# Patient Record
Sex: Female | Born: 1990 | Hispanic: No | Smoking: Never smoker
Health system: Southern US, Community
[De-identification: ages and names within clinical notes are randomized; demographics above are authoritative.]

## PROBLEM LIST (undated history)

## (undated) DIAGNOSIS — F419 Anxiety disorder, unspecified: Secondary | ICD-10-CM

## (undated) DIAGNOSIS — F329 Major depressive disorder, single episode, unspecified: Secondary | ICD-10-CM

## (undated) DIAGNOSIS — Z862 Personal history of diseases of the blood and blood-forming organs and certain disorders involving the immune mechanism: Secondary | ICD-10-CM

## (undated) DIAGNOSIS — F32A Depression, unspecified: Secondary | ICD-10-CM

## (undated) DIAGNOSIS — S92009A Unspecified fracture of unspecified calcaneus, initial encounter for closed fracture: Secondary | ICD-10-CM

## (undated) DIAGNOSIS — D693 Immune thrombocytopenic purpura: Secondary | ICD-10-CM

## (undated) HISTORY — DX: Personal history of diseases of the blood and blood-forming organs and certain disorders involving the immune mechanism: Z86.2

## (undated) HISTORY — DX: Major depressive disorder, single episode, unspecified: F32.9

## (undated) HISTORY — DX: Anxiety disorder, unspecified: F41.9

## (undated) HISTORY — DX: Unspecified fracture of unspecified calcaneus, initial encounter for closed fracture: S92.009A

## (undated) HISTORY — DX: Depression, unspecified: F32.A

## (undated) HISTORY — DX: Immune thrombocytopenic purpura: D69.3

---
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
## 2009-01-08 HISTORY — PX: GALLBLADDER SURGERY: SHX652

## 2009-06-10 DIAGNOSIS — D693 Immune thrombocytopenic purpura: Secondary | ICD-10-CM

## 2009-06-10 HISTORY — PX: SPLENECTOMY, TOTAL: SHX788

## 2009-06-10 HISTORY — DX: Immune thrombocytopenic purpura: D69.3

## 2010-05-10 HISTORY — PX: SPLENECTOMY: SUR1306

## 2014-09-12 ENCOUNTER — Encounter: Payer: Self-pay | Admitting: Certified Nurse Midwife

## 2014-09-12 ENCOUNTER — Ambulatory Visit (INDEPENDENT_AMBULATORY_CARE_PROVIDER_SITE_OTHER): Payer: BC Managed Care – PPO | Admitting: Certified Nurse Midwife

## 2014-09-12 VITALS — BP 110/70 | HR 88 | Resp 18 | Ht 66.0 in | Wt 144.0 lb

## 2014-09-12 DIAGNOSIS — Z124 Encounter for screening for malignant neoplasm of cervix: Secondary | ICD-10-CM | POA: Diagnosis not present

## 2014-09-12 DIAGNOSIS — Z30011 Encounter for initial prescription of contraceptive pills: Secondary | ICD-10-CM | POA: Diagnosis not present

## 2014-09-12 DIAGNOSIS — Z0000 Encounter for general adult medical examination without abnormal findings: Secondary | ICD-10-CM

## 2014-09-12 DIAGNOSIS — Z01419 Encounter for gynecological examination (general) (routine) without abnormal findings: Secondary | ICD-10-CM

## 2014-09-12 LAB — CBC WITH DIFFERENTIAL/PLATELET
Basophils Absolute: 0.1 10*3/uL (ref 0.0–0.1)
Basophils Relative: 1 % (ref 0–1)
Eosinophils Absolute: 0.1 10*3/uL (ref 0.0–0.7)
Eosinophils Relative: 1 % (ref 0–5)
HCT: 41.8 % (ref 36.0–46.0)
Hemoglobin: 14.2 g/dL (ref 12.0–15.0)
Lymphocytes Relative: 42 % (ref 12–46)
Lymphs Abs: 2.6 10*3/uL (ref 0.7–4.0)
MCH: 29.8 pg (ref 26.0–34.0)
MCHC: 34 g/dL (ref 30.0–36.0)
MCV: 87.6 fL (ref 78.0–100.0)
MPV: 10.5 fL (ref 8.6–12.4)
Monocytes Absolute: 0.4 10*3/uL (ref 0.1–1.0)
Monocytes Relative: 6 % (ref 3–12)
NEUTROS ABS: 3.1 10*3/uL (ref 1.7–7.7)
NEUTROS PCT: 50 % (ref 43–77)
Platelets: 361 10*3/uL (ref 150–400)
RBC: 4.77 MIL/uL (ref 3.87–5.11)
RDW: 13.6 % (ref 11.5–15.5)
WBC: 6.2 10*3/uL (ref 4.0–10.5)

## 2014-09-12 LAB — POCT URINALYSIS DIPSTICK
BILIRUBIN UA: NEGATIVE
Glucose, UA: NEGATIVE
Ketones, UA: NEGATIVE
Leukocytes, UA: NEGATIVE
Nitrite, UA: NEGATIVE
Protein, UA: NEGATIVE
RBC UA: NEGATIVE
Urobilinogen, UA: NEGATIVE
pH, UA: 6

## 2014-09-12 MED ORDER — LEVONORGEST-ETH ESTRAD 91-DAY 0.15-0.03 &0.01 MG PO TABS: 1.0000 | ORAL_TABLET | Freq: Every day | ORAL | Status: DC

## 2014-09-12 NOTE — Patient Instructions (Signed)
General topics  Next pap or exam is  due in 1 year Take a Women's multivitamin Take 1200 mg. of calcium daily - prefer dietary If any concerns in interim to call back  Breast Self-Awareness Practicing breast self-awareness may pick up problems early, prevent significant medical complications, and possibly save your life. By practicing breast self-awareness, you can become familiar with how your breasts look and feel and if your breasts are changing. This allows you to notice changes early. It can also offer you some reassurance that your breast health is good. One way to learn what is normal for your breasts and whether your breasts are changing is to do a breast self-exam. If you find a lump or something that was not present in the past, it is best to contact your caregiver right away. Other findings that should be evaluated by your caregiver include nipple discharge, especially if it is bloody; skin changes or reddening; areas where the skin seems to be pulled in (retracted); or new lumps and bumps. Breast pain is seldom associated with cancer (malignancy), but should also be evaluated by a caregiver. BREAST SELF-EXAM The best time to examine your breasts is 5 7 days after your menstrual period is over.  ExitCare Patient Information 2013 [employer].   Exercise to Stay Healthy Exercise helps you become and stay healthy. EXERCISE IDEAS AND TIPS Choose exercises that:  You enjoy.  Fit into your day. You do not need to exercise really hard to be healthy. You can do exercises at a slow or medium level and stay healthy. You can:  Stretch before and after working out.  Try yoga, Pilates, or tai chi.  Lift weights.  Walk fast, swim, jog, run, climb stairs, bicycle, dance, or rollerskate.  Take aerobic classes. Exercises that burn about 150 calories:  Running 1  miles in 15 minutes.  Playing volleyball for 45 to 60 minutes.  Washing and waxing a car for 45 to 60  minutes.  Playing touch football for 45 minutes.  Walking 1  miles in 35 minutes.  Pushing a stroller 1  miles in 30 minutes.  Playing basketball for 30 minutes.  Raking leaves for 30 minutes.  Bicycling 5 miles in 30 minutes.  Walking 2 miles in 30 minutes.  Dancing for 30 minutes.  Shoveling snow for 15 minutes.  Swimming laps for 20 minutes.  Walking up stairs for 15 minutes.  Bicycling 4 miles in 15 minutes.  Gardening for 30 to 45 minutes.  Jumping rope for 15 minutes.  Washing windows or floors for 45 to 60 minutes. Document Released: 06/29/2010 Document Revised: 08/19/2011 Document Reviewed: 06/29/2010 ExitCare Patient Information 2013 [employer].   Other topics ( that may be useful information):    Sexually Transmitted Disease Sexually transmitted disease (STD) refers to any infection that is passed from person to person during sexual activity. This may happen by way of saliva, semen, blood, vaginal mucus, or urine. Common STDs include:  Gonorrhea.  Chlamydia.  Syphilis.  HIV/AIDS.  Genital herpes.  Hepatitis B and C.  Trichomonas.  Human papillomavirus (HPV).  Pubic lice. CAUSES  An STD may be spread by bacteria, virus, or parasite. A person can get an STD by:  Sexual intercourse with an infected person.  Sharing sex toys with an infected person.  Sharing needles with an infected person.  Having intimate contact with the genitals, mouth, or rectal areas of an infected person. SYMPTOMS  Some people may not have any symptoms, but   they can still pass the infection to others. Different STDs have different symptoms. Symptoms include:  Painful or bloody urination.  Pain in the pelvis, abdomen, vagina, anus, throat, or eyes.  Skin rash, itching, irritation, growths, or sores (lesions). These usually occur in the genital or anal area.  Abnormal vaginal discharge.  Penile discharge in men.  Soft, flesh-colored skin growths in the  genital or anal area.  Fever.  Pain or bleeding during sexual intercourse.  Swollen glands in the groin area.  Yellow skin and eyes (jaundice). This is seen with hepatitis. DIAGNOSIS  To make a diagnosis, your caregiver may:  Take a medical history.  Perform a physical exam.  Take a specimen (culture) to be examined.  Examine a sample of discharge under a microscope.  Perform blood test TREATMENT   Chlamydia, gonorrhea, trichomonas, and syphilis can be cured with antibiotic medicine.  Genital herpes, hepatitis, and HIV can be treated, but not cured, with prescribed medicines. The medicines will lessen the symptoms.  Genital warts from HPV can be treated with medicine or by freezing, burning (electrocautery), or surgery. Warts may come back.  HPV is a virus and cannot be cured with medicine or surgery.However, abnormal areas may be followed very closely by your caregiver and may be removed from the cervix, vagina, or vulva through office procedures or surgery. If your diagnosis is confirmed, your recent sexual partners need treatment. This is true even if they are symptom-free or have a negative culture or evaluation. They should not have sex until their caregiver says it is okay. HOME CARE INSTRUCTIONS  All sexual partners should be informed, tested, and treated for all STDs.  Take your antibiotics as directed. Finish them even if you start to feel better.  Only take over-the-counter or prescription medicines for pain, discomfort, or fever as directed by your caregiver.  Rest.  Eat a balanced diet and drink enough fluids to keep your urine clear or pale yellow.  Do not have sex until treatment is completed and you have followed up with your caregiver. STDs should be checked after treatment.  Keep all follow-up appointments, Pap tests, and blood tests as directed by your caregiver.  Only use latex condoms and water-soluble lubricants during sexual activity. Do not use  petroleum jelly or oils.  Avoid alcohol and illegal drugs.  Get vaccinated for HPV and hepatitis. If you have not received these vaccines in the past, talk to your caregiver about whether one or both might be right for you.  Avoid risky sex practices that can break the skin. The only way to avoid getting an STD is to avoid all sexual activity.Latex condoms and dental dams (for oral sex) will help lessen the risk of getting an STD, but will not completely eliminate the risk. SEEK MEDICAL CARE IF:   You have a fever.  You have any new or worsening symptoms. Document Released: 08/17/2002 Document Revised: 08/19/2011 Document Reviewed: 08/24/2010 [facility] Patient Information 2013 [employer].    Domestic Abuse You are being battered or abused if someone close to you hits, pushes, or physically hurts you in any way. You also are being abused if you are forced into activities. You are being sexually abused if you are forced to have sexual contact of any kind. You are being emotionally abused if you are made to feel worthless or if you are constantly threatened. It is important to remember that help is available. No one has the right to abuse you. PREVENTION OF FURTHER  ABUSE  Learn the warning signs of danger. This varies with situations but may include: the use of alcohol, threats, isolation from friends and family, or forced sexual contact. Leave if you feel that violence is going to occur.  If you are attacked or beaten, report it to the police so the abuse is documented. You do not have to press charges. The police can protect you while you or the attackers are leaving. Get the officer's name and badge number and a copy of the report.  Find someone you can trust and tell them what is happening to you: your caregiver, a nurse, clergy member, close friend or family member. Feeling ashamed is natural, but remember that you have done nothing wrong. No one deserves abuse. Document Released:  05/24/2000 Document Revised: 08/19/2011 Document Reviewed: 08/02/2010 ExitCare Patient Information 2013 [employer].    How Much is Too Much Alcohol? Drinking too much alcohol can cause injury, accidents, and health problems. These types of problems can include:   Car crashes.  Falls.  Family fighting (domestic violence).  Drowning.  Fights.  Injuries.  Burns.  Damage to certain organs.  Having a baby with birth defects. ONE DRINK CAN BE TOO MUCH WHEN YOU ARE:  Working.  Pregnant or breastfeeding.  Taking medicines. Ask your doctor.  Driving or planning to drive. If you or someone you know has a drinking problem, get help from a doctor.  Document Released: 03/23/2009 Document Revised: 08/19/2011 Document Reviewed: 03/23/2009 ExitCare Patient Information 2013 [employer].   Smoking Hazards Smoking cigarettes is extremely bad for your health. Tobacco smoke has over 200 known poisons in it. There are over 60 chemicals in tobacco smoke that cause cancer. Some of the chemicals found in cigarette smoke include:   Cyanide.  Benzene.  Formaldehyde.  Methanol (wood alcohol).  Acetylene (fuel used in welding torches).  Ammonia. Cigarette smoke also contains the poisonous gases nitrogen oxide and carbon monoxide.  Cigarette smokers have an increased risk of many serious medical problems and Smoking causes approximately:  90% of all lung cancer deaths in men.  80% of all lung cancer deaths in women.  90% of deaths from chronic obstructive lung disease. Compared with nonsmokers, smoking increases the risk of:  Coronary heart disease by 2 to 4 times.  Stroke by 2 to 4 times.  Men developing lung cancer by 23 times.  Women developing lung cancer by 13 times.  Dying from chronic obstructive lung diseases by 12 times.  . Smoking is the most preventable cause of death and disease in our society.  WHY IS SMOKING ADDICTIVE?  Nicotine is the chemical  agent in tobacco that is capable of causing addiction or dependence.  When you smoke and inhale, nicotine is absorbed rapidly into the bloodstream through your lungs. Nicotine absorbed through the lungs is capable of creating a powerful addiction. Both inhaled and non-inhaled nicotine may be addictive.  Addiction studies of cigarettes and spit tobacco show that addiction to nicotine occurs mainly during the teen years, when young people begin using tobacco products. WHAT ARE THE BENEFITS OF QUITTING?  There are many health benefits to quitting smoking.   Likelihood of developing cancer and heart disease decreases. Health improvements are seen almost immediately.  Blood pressure, pulse rate, and breathing patterns start returning to normal soon after quitting. QUITTING SMOKING   American Lung Association - 1-800-LUNGUSA  American Cancer Society - 1-800-ACS-2345 Document Released: 07/04/2004 Document Revised: 08/19/2011 Document Reviewed: 03/08/2009 ExitCare Patient Information 2013 ExitCare,   LLC.   Stress Management Stress is a state of physical or mental tension that often results from changes in your life or normal routine. Some common causes of stress are:  Death of a loved one.  Injuries or severe illnesses.  Getting fired or changing jobs.  Moving into a new home. Other causes may be:  Sexual problems.  Business or financial losses.  Taking on a large debt.  Regular conflict with someone at home or at work.  Constant tiredness from lack of sleep. It is not just bad things that are stressful. It may be stressful to:  Win the lottery.  Get married.  Buy a new car. The amount of stress that can be easily tolerated varies from person to person. Changes generally cause stress, regardless of the types of change. Too much stress can affect your health. It may lead to physical or emotional problems. Too little stress (boredom) may also become stressful. SUGGESTIONS TO  REDUCE STRESS:  Talk things over with your family and friends. It often is helpful to share your concerns and worries. If you feel your problem is serious, you may want to get help from a professional counselor.  Consider your problems one at a time instead of lumping them all together. Trying to take care of everything at once may seem impossible. List all the things you need to do and then start with the most important one. Set a goal to accomplish 2 or 3 things each day. If you expect to do too many in a single day you will naturally fail, causing you to feel even more stressed.  Do not use alcohol or drugs to relieve stress. Although you may feel better for a short time, they do not remove the problems that caused the stress. They can also be habit forming.  Exercise regularly - at least 3 times per week. Physical exercise can help to relieve that "uptight" feeling and will relax you.  The shortest distance between despair and hope is often a good night's sleep.  Go to bed and get up on time allowing yourself time for appointments without being rushed.  Take a short "time-out" period from any stressful situation that occurs during the day. Close your eyes and take some deep breaths. Starting with the muscles in your face, tense them, hold it for a few seconds, then relax. Repeat this with the muscles in your neck, shoulders, hand, stomach, back and legs.  Take good care of yourself. Eat a balanced diet and get plenty of rest.  Schedule time for having fun. Take a break from your daily routine to relax. HOME CARE INSTRUCTIONS   Call if you feel overwhelmed by your problems and feel you can no longer manage them on your own.  Return immediately if you feel like hurting yourself or someone else. Document Released: 11/20/2000 Document Revised: 08/19/2011 Document Reviewed: 07/13/2007 ExitCare Patient Information 2013 ExitCare[employer].   

## 2014-09-12 NOTE — Progress Notes (Signed)
24 y.o. G0P0000 Single  Caucasian Fe here for to establish gyn care and for annual exam. Periods every three months with [facility] for contraception. Getting married in 06/10/15. History of ITP with splenectomy which resolved ITP. Last follow up over 6 months. Desires CBC today. Sees Urgent care if needed. No health issues today.  Patient's last menstrual period was 07/22/2014.          Sexually active: Yes.    The current method of family planning is OCP (estrogen/progesterone).    Exercising: Yes.    body weight, weight lifting, cardio Smoker:  no  Health Maintenance: Pap: 09/2013 Normal  No abnormal pap smears Self Breast Check: No Gardasil: No TDaP:  2011 Labs: Will talk with provider first  [id number]   reports that she has never smoked. She has never used smokeless tobacco. She reports that she drinks about 1.8 oz of alcohol per week. She reports that she does not use illicit drugs.  Past Medical History  Diagnosis Date  . ITP (idiopathic thrombocytopenic purpura) 2011    Spleenectomy     Past Surgical History  Procedure Laterality Date  . Cholecystectomy  2010  . 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 Levonorgest-Eth Estrad 91-Day (SEASONIQUE PO) Take 1 tablet by mouth daily.     No current facility-administered medications for this visit.    History reviewed. No pertinent family history.  ROS:  Pertinent items are noted in HPI.  Otherwise, a comprehensive ROS was negative.  Exam:   BP 110/70 mmHg  Pulse 88  Resp 18  Ht  (1.676 m)  Wt 144 lb (65.318 kg)  BMI 23.25 kg/m2  LMP 07/22/2014 Height:  (167.6 cm) Ht Readings from Last 3 Encounters:  09/12/14  (1.676 m)    General appearance: alert, cooperative and appears stated age Head: Normocephalic, without obvious abnormality, atraumatic Neck: no adenopathy, supple, symmetrical, trachea midline and thyroid normal to  inspection and palpation Lungs: clear to auscultation bilaterally Breasts: normal appearance, no masses or tenderness, No nipple retraction or dimpling, No nipple discharge or bleeding, No axillary or supraclavicular adenopathy Heart: regular rate and rhythm Abdomen: soft, non-tender; no masses,  no organomegaly Extremities: extremities normal, atraumatic, no cyanosis or edema Skin: Skin color, texture, turgor normal. No rashes or lesions Lymph nodes: Cervical, supraclavicular, and axillary nodes normal. No abnormal inguinal nodes palpated Neurologic: Grossly normal   Pelvic: External genitalia:  no lesions              Urethra:  normal appearing urethra with no masses, tenderness or lesions              Bartholin's and Skene's: normal                 Vagina: normal appearing vagina with normal color and discharge, no lesions              Cervix: normal,non tender, no lesions              Pap taken: Yes.   Bimanual Exam:  Uterus:  normal size, contour, position, consistency, mobility, non-tender              Adnexa: normal adnexa and no mass, fullness, tenderness               Rectovaginal: Confirms  Anus:  normal appearance  Chaperone present: Yes  A:  Well Woman with normal exam  Contraception OCP desired  History of ITP with Splenectomy, ITP resolved with surgery needs follow up CBC with diff today, since moving  Gardasil candidate  P:   Reviewed health and wellness pertinent to exam  Rx Camrese see order  Lab: CBC with diff. Discussed may need follow up if abnormal  Aware of benefits declines today  Pap smear taken today with HPV reflex   counseled on breast self exam, HIV risk factors and prevention, adequate intake of calcium and vitamin D, diet and exercise  return annually or prn  An After Visit Summary was printed and given to the patient.

## 2014-09-12 NOTE — Progress Notes (Signed)
Reviewed personally.  M. Suzanne Garon Melander, MD.  

## 2014-09-13 LAB — IPS PAP TEST WITH REFLEX TO HPV

## 2014-09-21 ENCOUNTER — Telehealth: Payer: Self-pay | Admitting: Certified Nurse Midwife

## 2014-09-21 NOTE — Telephone Encounter (Signed)
Left message to reschedule aex appointment. °

## 2015-09-15 ENCOUNTER — Ambulatory Visit (INDEPENDENT_AMBULATORY_CARE_PROVIDER_SITE_OTHER): Payer: BC Managed Care – PPO | Admitting: Certified Nurse Midwife

## 2015-09-15 ENCOUNTER — Encounter: Payer: Self-pay | Admitting: Certified Nurse Midwife

## 2015-09-15 VITALS — BP 102/68 | HR 70 | Resp 16 | Ht 65.25 in | Wt 149.0 lb

## 2015-09-15 DIAGNOSIS — Z862 Personal history of diseases of the blood and blood-forming organs and certain disorders involving the immune mechanism: Secondary | ICD-10-CM

## 2015-09-15 DIAGNOSIS — Z01419 Encounter for gynecological examination (general) (routine) without abnormal findings: Secondary | ICD-10-CM | POA: Diagnosis not present

## 2015-09-15 DIAGNOSIS — Z30011 Encounter for initial prescription of contraceptive pills: Secondary | ICD-10-CM | POA: Diagnosis not present

## 2015-09-15 LAB — CBC
HCT: 42.7 % (ref 35.0–45.0)
HEMOGLOBIN: 14.2 g/dL (ref 11.7–15.5)
MCH: 29.3 pg (ref 27.0–33.0)
MCHC: 33.3 g/dL (ref 32.0–36.0)
MCV: 88.2 fL (ref 80.0–100.0)
MPV: 10.7 fL (ref 7.5–12.5)
Platelets: 347 10*3/uL (ref 140–400)
RBC: 4.84 MIL/uL (ref 3.80–5.10)
RDW: 13.6 % (ref 11.0–15.0)
WBC: 8.1 10*3/uL (ref 3.8–10.8)

## 2015-09-15 MED ORDER — LEVONORGEST-ETH ESTRAD 91-DAY 0.15-0.03 &0.01 MG PO TABS
1.0000 | ORAL_TABLET | Freq: Every day | ORAL | Status: DC
Start: 2015-09-15 — End: 2016-09-17

## 2015-09-15 NOTE — Patient Instructions (Signed)

## 2015-09-15 NOTE — Progress Notes (Signed)
25 y.o. G0P0000 Married  Caucasian Fe here for annual exam. Periods normal, no issues. Contraception working well. Honeymoon was [place]! No health issues  today. Sees Urgent care if needed.  Patient's last menstrual period was 07/20/2015.          Sexually active: Yes.    The current method of family planning is OCP (estrogen/progesterone).    Exercising: Yes.    cardio Smoker:  no  Health Maintenance: Pap:  09-12-14 neg MMG:  none Colonoscopy:  none BMD:   none TDaP:  2011 Shingles: no Pneumonia: no Hep C and HIV: HIV neg 2011 Labs: none Self breast exam: not done   reports that she has never smoked. She has never used smokeless tobacco. She reports that she drinks about 0.6 - 1.8 oz of alcohol per week. She reports that she does not use illicit drugs.  Past Medical History  Diagnosis Date  . ITP (idiopathic thrombocytopenic purpura) 2011    Spleenectomy     Past Surgical History  Procedure Laterality Date  . Cholecystectomy  2010  . 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 Levonorgestrel-Ethinyl Estradiol (AMETHIA,CAMRESE) 0.15-0.03 &0.01 MG tablet Take 1 tablet by mouth daily. 1 Package 4   No current facility-administered medications for this visit.    Family History  Problem Relation Age of Onset  . Hypertension Mother   . Stroke Paternal Grandfather   . Hypertension Maternal Grandmother     ROS:  Pertinent items are noted in HPI.  Otherwise, a comprehensive ROS was negative.  Exam:   BP 102/68 mmHg  Pulse 70  Resp 16  Ht 5' 5.25" (1.657 m)  Wt 149 lb (67.586 kg)  BMI 24.62 kg/m2  LMP 07/20/2015 Height: 5' 5.25" (165.7 cm) Ht Readings from Last 3 Encounters:  09/15/15 5' 5.25" (1.657 m)  09/12/14 5\' 6"  (1.676 m)    General appearance: alert, cooperative and appears stated age Head: Normocephalic, without obvious abnormality, atraumatic Neck: no adenopathy, supple, symmetrical,  trachea midline and thyroid normal to inspection and palpation Lungs: clear to auscultation bilaterally Breasts: normal appearance, no masses or tenderness, No nipple retraction or dimpling, No nipple discharge or bleeding, No axillary or supraclavicular adenopathy Heart: regular rate and rhythm Abdomen: soft, non-tender; no masses,  no organomegaly Extremities: extremities normal, atraumatic, no cyanosis or edema Skin: Skin color, texture, turgor normal. No rashes or lesions Lymph nodes: Cervical, supraclavicular, and axillary nodes normal. No abnormal inguinal nodes palpated Neurologic: Grossly normal   Pelvic: External genitalia:  no lesions              Urethra:  normal appearing urethra with no masses, tenderness or lesions              Bartholin's and Skene's: normal                 Vagina: normal appearing vagina with normal color and discharge, no lesions              Cervix: no cervical motion tenderness, no lesions and nulliparous appearance              Pap taken: No. Bimanual Exam:  Uterus:  normal size, contour, position, consistency, mobility, non-tender              Adnexa: normal adnexa and no mass, fullness, tenderness  Rectovaginal: Confirms               Anus:  normal   Chaperone present: yes  A:  Well Woman with normal exam  Contraception desires OCP  History of ITP, needs CBC  P:   Reviewed health and wellness pertinent to exam  Rx Amethia see order  Lab CBC  Pap smear as above not taken   counseled on breast self exam, use and side effects of OCP's, adequate intake of calcium and vitamin D, diet and exercise  return annually or prn    An After Visit Summary was printed and given to the patient.

## 2015-09-18 ENCOUNTER — Ambulatory Visit: Payer: BC Managed Care – PPO | Admitting: Certified Nurse Midwife

## 2015-09-26 NOTE — Progress Notes (Signed)
Encounter reviewed Jill Jertson, MD   

## 2015-10-15 ENCOUNTER — Other Ambulatory Visit: Payer: Self-pay | Admitting: Certified Nurse Midwife

## 2015-10-16 NOTE — Telephone Encounter (Signed)
09/15/15 #1 pack/12 rfs was sent to [facility] in [place]-rx refused.

## 2016-01-16 ENCOUNTER — Other Ambulatory Visit: Payer: Self-pay | Admitting: Certified Nurse Midwife

## 2016-01-16 DIAGNOSIS — Z30011 Encounter for initial prescription of contraceptive pills: Secondary | ICD-10-CM

## 2016-01-16 NOTE — Telephone Encounter (Signed)
Medication refill request: DAYSEE 0.15-0.03 Last AEX:  09/15/15 DL Next AEX: 4/09/814/10/18 Last MMG (if hormonal medication request): n/a Refill authorized: 09/15/15 #1pack w/12refills; today refused, patient should have enough refills

## 2016-09-17 ENCOUNTER — Ambulatory Visit (INDEPENDENT_AMBULATORY_CARE_PROVIDER_SITE_OTHER): Payer: BC Managed Care – PPO | Admitting: Certified Nurse Midwife

## 2016-09-17 ENCOUNTER — Encounter: Payer: Self-pay | Admitting: Certified Nurse Midwife

## 2016-09-17 VITALS — BP 120/70 | HR 60 | Resp 16 | Ht 65.25 in | Wt 162.0 lb

## 2016-09-17 DIAGNOSIS — Z01419 Encounter for gynecological examination (general) (routine) without abnormal findings: Secondary | ICD-10-CM | POA: Diagnosis not present

## 2016-09-17 DIAGNOSIS — Z30011 Encounter for initial prescription of contraceptive pills: Secondary | ICD-10-CM | POA: Diagnosis not present

## 2016-09-17 DIAGNOSIS — Z862 Personal history of diseases of the blood and blood-forming organs and certain disorders involving the immune mechanism: Secondary | ICD-10-CM

## 2016-09-17 LAB — CBC
HEMATOCRIT: 40.3 % (ref 35.0–45.0)
Hemoglobin: 13.2 g/dL (ref 11.7–15.5)
MCH: 28.6 pg (ref 27.0–33.0)
MCHC: 32.8 g/dL (ref 32.0–36.0)
MCV: 87.4 fL (ref 80.0–100.0)
MPV: 10.2 fL (ref 7.5–12.5)
Platelets: 356 10*3/uL (ref 140–400)
RBC: 4.61 MIL/uL (ref 3.80–5.10)
RDW: 14 % (ref 11.0–15.0)
WBC: 10.8 10*3/uL (ref 3.8–10.8)

## 2016-09-17 MED ORDER — LEVONORGEST-ETH ESTRAD 91-DAY 0.15-0.03 &0.01 MG PO TABS: 1.0000 | ORAL_TABLET | Freq: Every day | ORAL | 4 refills | Status: DC

## 2016-09-17 NOTE — Progress Notes (Signed)
26 y.o. G0P0000 Married  Caucasian Fe here for annual exam. Periods normal no issues. Has missed 2 pills in the past year.  Contraception Seasonique working well. No health issues. Sees Urgent care if needed. No further problems with ITP. Not sure if she needed further follow up. Planning time for self and spouse.  Patient's last menstrual period was 07/18/2016 (exact date).          Sexually active: Yes.    The current method of family planning is OCP (estrogen/progesterone).    Exercising: Yes.    running, rowing, weights Smoker:  no  Health Maintenance: Pap:  09-12-14 neg MMG:  none Colonoscopy:  none BMD:   none TDaP:  2011 Shingles: no Pneumonia:  Hep C and HIV: HIV neg 2011 Labs: none Self breast exam: not done   reports that she has never smoked. She has never used smokeless tobacco. She reports that she drinks alcohol. She reports that she does not use drugs.  Past Medical History:  Diagnosis Date  . 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 CHOLECYSTECTOMY  2010  . 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 Levonorgestrel-Ethinyl Estradiol (AMETHIA,CAMRESE) 0.15-0.03 &0.01 MG tablet Take 1 tablet by mouth daily. 1 Package 12   No current facility-administered medications for this visit.     Family History  Problem Relation Age of Onset  . Hypertension Mother   . Stroke Paternal Grandfather   . Hypertension Maternal Grandmother     ROS:  Pertinent items are noted in HPI.  Otherwise, a comprehensive ROS was negative.  Exam:   BP 120/70   Pulse 60   Resp 16   Ht 5' 5.25" (1.657 m)   Wt 162 lb (73.5 kg)   LMP 07/18/2016 (Exact Date)   BMI 26.75 kg/m  Height: 5' 5.25" (165.7 cm) Ht Readings from Last 3 Encounters:  09/17/16 5' 5.25" (1.657 m)  09/15/15 5' 5.25" (1.657 m)  09/12/14  (1.676 m)    General  appearance: alert, cooperative and appears stated age Head: Normocephalic, without obvious abnormality, atraumatic Neck: no adenopathy, supple, symmetrical, trachea midline and thyroid normal to inspection and palpation Lungs: clear to auscultation bilaterally Breasts: normal appearance, no masses or tenderness, No nipple retraction or dimpling, No nipple discharge or bleeding, No axillary or supraclavicular adenopathy Heart: regular rate and rhythm Abdomen: soft, non-tender; no masses,  no organomegaly Extremities: extremities normal, atraumatic, no cyanosis or edema Skin: Skin color, texture, turgor normal. No rashes or lesions Lymph nodes: Cervical, supraclavicular, and axillary nodes normal. No abnormal inguinal nodes palpated Neurologic: Grossly normal   Pelvic: External genitalia:  no lesions              Urethra:  normal appearing urethra with no masses, tenderness or lesions              Bartholin's and Skene's: normal                 Vagina: normal appearing vagina with normal color and discharge, no lesions              Cervix: no cervical motion tenderness, no lesions and nulliparous appearance              Pap taken: No. Bimanual Exam:  Uterus:  normal size, contour, position, consistency, mobility, non-tender and  anteverted              Adnexa: normal adnexa and no mass, fullness, tenderness               Rectovaginal: Confirms               Anus:  normal sphincter tone, no lesions  Chaperone present: yes  A:  Well Woman with normal exam  Contraception OCP desired  History of ITP with spleenectomy for control  P:   Reviewed health and wellness pertinent to exam  Rx Seasonique see order with instructions  Will have patient sign for records where care was given and advise.  Lab CBC  Pap smear as above   counseled on breast self exam, use and side effects of OCP's, adequate intake of calcium and vitamin D, diet and exercise  return annually or prn  An After Visit  Summary was printed and given to the patient.

## 2016-09-17 NOTE — Patient Instructions (Signed)

## 2016-09-18 NOTE — Progress Notes (Signed)
Encounter reviewed Kaiden Dardis, MD   

## 2016-10-10 ENCOUNTER — Other Ambulatory Visit: Payer: Self-pay | Admitting: Certified Nurse Midwife

## 2016-10-10 DIAGNOSIS — Z30011 Encounter for initial prescription of contraceptive pills: Secondary | ICD-10-CM

## 2017-06-23 ENCOUNTER — Ambulatory Visit: Payer: BC Managed Care – PPO | Admitting: Family Medicine

## 2017-06-23 ENCOUNTER — Encounter: Payer: Self-pay | Admitting: Family Medicine

## 2017-06-23 ENCOUNTER — Other Ambulatory Visit: Payer: Self-pay

## 2017-06-23 VITALS — BP 146/94 | HR 83 | Temp 99.3°F | Resp 16 | Ht 66.34 in | Wt 162.8 lb

## 2017-06-23 DIAGNOSIS — R0789 Other chest pain: Secondary | ICD-10-CM

## 2017-06-23 MED ORDER — CYCLOBENZAPRINE HCL 10 MG PO TABS: 10.0000 mg | ORAL_TABLET | Freq: Three times a day (TID) | ORAL | 0 refills | Status: DC | PRN

## 2017-06-23 NOTE — Patient Instructions (Signed)
1. Most likely muscle sprain. Rest, ice and gentle stretching. Continue with ibuprofen as needed. Adding a muscle relaxant. It can make you a bit tired, if so either taken only at bedtime, or try 1/2 tab during the day.

## 2017-06-23 NOTE — Progress Notes (Signed)
1/14/20191:14 PM  Ana Becker 12/04/1990, 27 y.o. female [id number]  Chief Complaint  Patient presents with  . Chest Pain    x1 week after excercising - rowing. Left side. Constant. Hx of anxiety. Alternating Tylenol and advil, is helping. Pain wraps around to back. Discomfort in neck and shoulder.    HPI:   Patient is a 27 y.o. female who presents today for left sided chest pain. Patient states that it first right after she completed a marathon in December. Pain under her left axilla. Intermittent. Eventually resolved. However a week ago she was rowing, as exercise, and it flared up. This time it has not resolved and now it radiates to her left ant chest, up her neck and towards her back. Has been doing tylenol, ibuprofen, anti-acids, heat pack without much relief. Patient worried it might be cardiac.  No flowsheet data found.  No Known Allergies  Prior to Admission medications   Medication Sig Start Date End Date Taking? Authorizing Provider  ALPRAZolam Prudy Feeler) 0.5 MG tablet Take 0.5 mg by mouth as needed for anxiety.   Yes [provider]  famotidine (PEPCID) 10 MG tablet Take 10 mg by mouth daily.   Yes [provider]  lansoprazole (PREVACID) 15 MG capsule Take 15 mg by mouth daily.   Yes [provider]  Levonorgestrel-Ethinyl Estradiol (AMETHIA,CAMRESE) 0.15-0.03 &0.01 MG tablet Take 1 tablet by mouth daily. 09/17/16  Yes Verner Chol, CNM    Past Medical History:  Diagnosis Date  . Anxiety   . Depression   . History of ITP   . 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 CHOLECYSTECTOMY  2010  . GALLBLADDER SURGERY  01/2009  . SPLENECTOMY  05/2010  . 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 Smoking status: Never Smoker  . Smokeless tobacco: Never Used  Substance Use Topics  . Alcohol use: Yes    Alcohol/week: 0.0 - 0.6  oz    Family History  Problem Relation Age of Onset  . Hypertension Mother   . Heart disease Father   . Stroke Paternal Grandfather   . Hypertension Maternal Grandmother   . Hypertension Maternal Grandfather   . Heart disease Maternal Grandfather     ROS Per hpi  OBJECTIVE:  Blood pressure (!) 146/94, pulse 83, temperature 99.3 F (37.4 C), resp. rate 16, height 5' 6.34" (1.685 m), weight 162 lb 12.8 oz (73.8 kg), last menstrual period 04/23/2017, SpO2 99 %.  Physical Exam  Constitutional: She is oriented to person, place, and time and well-developed, well-nourished, and in no distress.  HENT:  Head: Normocephalic and atraumatic.  Mouth/Throat: Oropharynx is clear and moist. No oropharyngeal exudate.  Eyes: EOM are normal. Pupils are equal, round, and reactive to light. No scleral icterus.  Neck: Neck supple.  Cardiovascular: Normal rate, regular rhythm, normal heart sounds and intact distal pulses. Exam reveals no gallop and no friction rub.  No murmur heard. Pulmonary/Chest: Effort normal and breath sounds normal. She has no wheezes. She has no rales.  Patient with point tenderness along left intercostal pain, about 4 FB inferior to axilla  Musculoskeletal: She exhibits no edema.  Neurological: She is alert and oriented to person, place, and time. Gait normal.  Skin: Skin is warm and dry.    ASSESSMENT and PLAN  1. Other chest pain Discussed MSK  etiology, conservative measures, rest, ice, cont with nsaids. Adding muscle relaxant, gentle stretching.  - EKG 12-Lead - nsr, no st changes noted, no qwaves - cyclobenzaprine (FLEXERIL) 10 MG tablet; Take 1 tablet (10 mg total) by mouth 3 (three) times daily as needed for muscle spasms.  Return if symptoms worsen or fail to improve.    Myles LippsIrma M Santiago, MD Primary Care at [facility] [address] [place], [place] [address] Ph.  [phone] Fax [phone]

## 2017-09-02 ENCOUNTER — Ambulatory Visit (INDEPENDENT_AMBULATORY_CARE_PROVIDER_SITE_OTHER): Payer: BC Managed Care – PPO

## 2017-09-02 ENCOUNTER — Ambulatory Visit: Payer: BC Managed Care – PPO | Admitting: Family Medicine

## 2017-09-02 ENCOUNTER — Other Ambulatory Visit: Payer: Self-pay

## 2017-09-02 ENCOUNTER — Encounter: Payer: Self-pay | Admitting: Family Medicine

## 2017-09-02 VITALS — BP 125/86 | HR 88 | Temp 98.6°F | Ht 66.5 in | Wt 166.2 lb

## 2017-09-02 DIAGNOSIS — M79672 Pain in left foot: Secondary | ICD-10-CM

## 2017-09-02 MED ORDER — TRAMADOL HCL 50 MG PO TABS: 50.0000 mg | ORAL_TABLET | Freq: Three times a day (TID) | ORAL | 0 refills | Status: DC | PRN

## 2017-09-02 NOTE — Progress Notes (Signed)
3/26/20193:24 PM  Kathyrn DrownHanna Carcamo 11/21/1990, 27 y.o. female [id number]  Chief Complaint  Patient presents with  . Pain    left foot pain due to running. Has been having pain since for 1 wk    HPI:   Patient is a 27 y.o. female who presents today for 1 week of left medial heel pain. She ran a marathon in December and has slowly been increasing her distance again. On Sunday she ran 10 miles for the first time since the marathon. She started noticing the pain on Monday, by the evening she was not able to walk on it. She continues to experience constant pain that is not relived by OTC medications, rest, change in shoes. She does not recall an actual incident/injury. She has never had a stress fracture or any other injuries to her foot.   Depression screen PHQ 2/9 09/02/2017  Decreased Interest 0  Down, Depressed, Hopeless 0  PHQ - 2 Score 0    No Known Allergies  Prior to Admission medications   Medication Sig Start Date End Date Taking? Authorizing Provider  Levonorgestrel-Ethinyl Estradiol (AMETHIA,CAMRESE) 0.15-0.03 &0.01 MG tablet Take 1 tablet by mouth daily. 09/17/16  Yes Verner CholLeonard, Deborah S, CNM    Past Medical History:  Diagnosis Date  . Anxiety   . Depression   . History of ITP   . 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 CHOLECYSTECTOMY  2010  . GALLBLADDER SURGERY  01/2009  . SPLENECTOMY  05/2010  . 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 Smoking status: Never Smoker  . Smokeless tobacco: Never Used  Substance Use Topics  . Alcohol use: Yes    Alcohol/week: 0.0 - 0.6 oz    Family History  Problem Relation Age of Onset  . Hypertension Mother   . Heart disease Father   . Stroke Paternal Grandfather   . Hypertension Maternal Grandmother   . Hypertension Maternal Grandfather   . Heart disease Maternal Grandfather     ROS Per  hpi  OBJECTIVE:  Blood pressure 125/86, pulse 88, temperature 98.6 F (37 C), temperature source Oral, height 5' 6.5" (1.689 m), weight 166 lb 3.2 oz (75.4 kg), SpO2 99 %.  Physical Exam  Gen: AAOx3, NAD MSK: left foot, ankle, FROM, TTP along medial aspect of heel, No swelling or bruising. Neurovasculalry intact.   Dg Foot Complete Left  Result Date: 09/02/2017 CLINICAL DATA:  Left heel pain for 1 week.  Methadone runner. EXAM: LEFT FOOT - COMPLETE 3+ VIEW COMPARISON:  No recent prior. FINDINGS: No acute bony or joint abnormality. No evidence of fracture. No evidence of dislocation. Soft tissue structures unremarkable. IMPRESSION: No acute abnormality. Electronically Signed   By: Maisie Fushomas  Register   On: 09/02/2017 16:15     ASSESSMENT and PLAN 1. Pain of left heel Xray negative for fracture. Discussed if stress fracture, repeat imaging is usually needed. Will provide crutches so that she can be non weight bearing for the next several days and allow rest as ambulation is still quite painful.  - DG Foot Complete Left; Future - Ambulatory referral to Sports Medicine  Other orders - Crutches - traMADol (ULTRAM) 50 MG tablet; Take 1 tablet (50 mg total) by mouth every 8 (eight) hours as needed.  Return if symptoms worsen or fail to improve.    Myles LippsIrma M Santiago,  MD Primary Care at [place], [place] [address] Ph.  [phone] Fax [phone]

## 2017-09-02 NOTE — Patient Instructions (Addendum)
Please call [facility] at [phone] to schedule an appointment. We want you seen within a week from today.   Please use the crutches if you are painful with bearing weight while you walk.     IF you received an x-ray today, you will receive an invoice from [facility]. Please contact [facility] at [phone] with questions or concerns regarding your invoice.   IF you received labwork today, you will receive an invoice from [facility]. Please contact [facility] at [phone] with questions or concerns regarding your invoice.   Our billing staff will not be able to assist you with questions regarding bills from these companies.  You will be contacted with the lab results as soon as they are available. The fastest way to get your results is to activate your My Chart account. Instructions are located on the last page of this paperwork. If you have not heard from us regarding the results in 2 weeks, please contact this office.

## 2017-09-03 ENCOUNTER — Ambulatory Visit: Payer: BC Managed Care – PPO | Admitting: Family Medicine

## 2017-09-03 ENCOUNTER — Encounter: Payer: Self-pay | Admitting: Family Medicine

## 2017-09-03 VITALS — BP 120/86 | Ht 66.0 in | Wt 163.0 lb

## 2017-09-03 DIAGNOSIS — M84375D Stress fracture, left foot, subsequent encounter for fracture with routine healing: Secondary | ICD-10-CM | POA: Insufficient documentation

## 2017-09-03 DIAGNOSIS — M84376A Stress fracture, unspecified foot, initial encounter for fracture: Secondary | ICD-10-CM

## 2017-09-03 NOTE — Assessment & Plan Note (Signed)
Her exam and history are highly consistent with a stress fracture or stress reaction. The negative previous xray is not a highly sensitive test so this remains the most likely mechanism of injury. Management of either process is the same. The exam is not consistent with achilles tendonitis or plantar fasciitis. She does not feel strongly about pursuing advanced imaging urgently so we will presumptively treat this as a clinical diagnosis.  Recommend use of a low profile CAM boot and avoiding exercises for at least one more week, then can resume non impact activity that is not painful. She can use ice for pain up to 15 minutes at a time as needed. She is also recommended to start vitamin D supplementation at least 1300mg  calcium 800IU vitamin D. She should return for evaluation in about 2 weeks before any return to running.

## 2017-09-03 NOTE — Patient Instructions (Signed)
You have a calcaneal stress fracture or stress reaction. No running or weight bearing exercise until I see you back (ellipitical, squats, lunges). Wear cam walker when up and walking around for next 2 weeks. Use crutches as needed but it's ok to put weight on this as long as you're not running on it. Icing 15 minutes at a time 3-4 times a day as needed. Calcium 1300 mg and Vitamin D 800 IU daily. Tylenol if needed for pain. Wait a week before you try to use the rowing machine - if this causes pain, stop but I think you'll be able to do this. Follow up with me in 2 weeks. Expect this to take 6 weeks to heal.

## 2017-09-03 NOTE — Progress Notes (Signed)
Consultation requested by Dr. Leretha Pol  HPI  Ana Becker is a 27 y/o woman here for evaluation of left heel pain since last Monday (3/18). She was training for an upcoming half marathon and had recently increased her running intensity to about 26 miles/week and more speed training over the past 3 weeks. Her pain is present throughout the day but bothers her most with weightbearing and walking. She has avoided running on the heel since last Sunday due to the pain. She saw Dr. Leretha Pol at [facility] yesterday who referred her for sports medicine evaluation of suspected heel stress fracture. Xrays were checked at that visit showing no obvious abnormality. She was prescribed tramadol for pain but has not started taking this, she is using crutches to avoid painful walking which help. She has never suffered a previous stress fracture and has never injured her left foot before.  No skin changes, numbness.  Not taking calcium or vitamin D.  Has regular menses.    CC: Left heel pain  Medications/Interventions Tried: Crutches  See HPI and/or previous note for associated ROS.  Past Medical History:  Diagnosis Date  . Anxiety   . Depression   . History of ITP   . 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 Hypertension Mother   . Heart disease Father   . Stroke Paternal Grandfather   . Hypertension Maternal Grandmother   . Hypertension Maternal Grandfather   . Heart disease Maternal Grandfather    Social History   Socioeconomic History  . Marital status: Married    Spouse name: Not on file  . Number of children: Not on file  . Years of education: Not on file  . Highest education level: Not on file  Tobacco Use  . Smoking status: Never Smoker  . Smokeless tobacco: Never Used  Substance and Sexual Activity  . Alcohol use: Yes    Alcohol/week: 0.0 - 0.6 oz  . Drug use: No  . Sexual activity: Yes   Partners: Male    Birth control/protection: Pill    Objective: BP 120/86   Ht 5\' 6"  (1.676 m)   Wt 163 lb (73.9 kg)   BMI 26.31 kg/m  Gen:  NAD, well groomed, normal affect.  CV: Well-perfused. Feet are warm though slightly clammy skin. Resp: Non-labored.  Neuro: Sensation intact throughout. Gait: Walking with crutches, can take several steps of grossly normal gait MSK: Feet appear symmetric without obvious swelling or erythema, ankle ROM is normal in both ankles No tenderness over achilles tendon insertion and no pain on the sole with pressure, positive calcaneal squeeze test  Assessment and plan:  Stress fracture of calcaneus due to multiple or repetitive stress, initial encounter Her exam and history are highly consistent with a stress fracture or stress reaction. The negative previous xray is not a highly sensitive test so this remains the most likely mechanism of injury. Management of either process is the same. The exam is not consistent with achilles tendonitis or plantar fasciitis. She does not feel strongly about pursuing advanced imaging urgently so we will presumptively treat this as a clinical diagnosis.  Recommend use of a low profile CAM boot and avoiding exercises for at least one more week, then can resume non impact activity that is not painful. She can use ice for pain up to 15 minutes at a time as needed. She is also recommended to start vitamin D  supplementation at least 1300mg  calcium 800IU vitamin D. She should return for evaluation in about 2 weeks before any return to running.   Ana Planhristopher W Ronnie Doo, MD PGY-III Internal Medicine Resident 09/03/2017, 3:52 PM  Patient seen and examined with Resident, agree with his note and plan.    Left foot/ankle: No gross deformity, swelling, ecchymoses FROM with 5/5 strength and no pain. TTP medial calcaneus.  No tenderness over medial ankle tendons, achilles, or plantar fascia. Positive calcaneal squeeze test. Cannot  perform hop test due to pain. Negative ant drawer and talar tilt.   Negative syndesmotic compression. Thompsons test negative. NV intact distally.  Right foot/ankle: No deformity. FROM with 5/5 strength. No tenderness to palpation. NVI distally.  We discussed conservative treatment vs MRI to confirm.  Advised ultrasound is poor at identifying stress fractures of non-long bones.  Unfortunately there is not an alternative diagnosis in differential than stress fracture/reaction of calcaneus and exam supports this.  She opted for conservative treatment.  Short cam walker, no running.  Icing, calcium, vitamin D.  Tylenol if needed.  F/u in 2 weeks.

## 2017-09-17 ENCOUNTER — Encounter: Payer: Self-pay | Admitting: Family Medicine

## 2017-09-17 ENCOUNTER — Ambulatory Visit: Payer: BC Managed Care – PPO | Admitting: Family Medicine

## 2017-09-17 DIAGNOSIS — M84375D Stress fracture, left foot, subsequent encounter for fracture with routine healing: Secondary | ICD-10-CM | POA: Diagnosis not present

## 2017-09-17 NOTE — Patient Instructions (Signed)
You're doing great! Use the boot for 2 more weeks then switch to a supportive shoe like a tennis shoe or running shoe. No running for 4 more weeks. I'd wait 1 week before elliptical. 2 weeks before trying squats, lunges, leg press. Ok for cycling, swimming now as long as this doesn't cause pain. Continue calcium and vitamin D. Follow up with me in 4 weeks.

## 2017-09-18 ENCOUNTER — Other Ambulatory Visit (HOSPITAL_COMMUNITY)
Admission: RE | Admit: 2017-09-18 | Discharge: 2017-09-18 | Disposition: A | Discharge status: Home / Self Care | Payer: BC Managed Care – PPO | Source: Ambulatory Visit | Attending: Obstetrics & Gynecology | Admitting: Obstetrics & Gynecology

## 2017-09-18 ENCOUNTER — Encounter: Payer: Self-pay | Admitting: Certified Nurse Midwife

## 2017-09-18 ENCOUNTER — Ambulatory Visit: Payer: BC Managed Care – PPO | Admitting: Certified Nurse Midwife

## 2017-09-18 ENCOUNTER — Other Ambulatory Visit: Payer: Self-pay

## 2017-09-18 VITALS — BP 120/80 | HR 70 | Resp 16 | Ht 65.5 in | Wt 167.0 lb

## 2017-09-18 DIAGNOSIS — Z862 Personal history of diseases of the blood and blood-forming organs and certain disorders involving the immune mechanism: Secondary | ICD-10-CM | POA: Diagnosis not present

## 2017-09-18 DIAGNOSIS — Z01419 Encounter for gynecological examination (general) (routine) without abnormal findings: Secondary | ICD-10-CM | POA: Diagnosis not present

## 2017-09-18 DIAGNOSIS — Z124 Encounter for screening for malignant neoplasm of cervix: Secondary | ICD-10-CM

## 2017-09-18 DIAGNOSIS — Z3041 Encounter for surveillance of contraceptive pills: Secondary | ICD-10-CM | POA: Diagnosis not present

## 2017-09-18 MED ORDER — LEVONORGEST-ETH ESTRAD 91-DAY 0.15-0.03 &0.01 MG PO TABS: 1.0000 | ORAL_TABLET | Freq: Every day | ORAL | 4 refills | Status: DC

## 2017-09-18 NOTE — Progress Notes (Signed)
27 y.o. G0P0000 Married  Caucasian Fe here for annual exam. Periods normal, no issues. Contraception working well, desires continuance. Saw [facility] for pull muscle and now recovering from stress fracture in heel. Ran first Marathon in 12/18! No health issues today.  Patient's last menstrual period was 07/17/2017.          Sexually active: Yes.    The current method of family planning is OCP (estrogen/progesterone).    Exercising: Yes.    running & rowing (none in 2 weeks due to injury) Smoker:  no  Health Maintenance: Pap:  09-12-14 neg History of Abnormal Pap: no MMG:  none Self Breast exams: occ Colonoscopy:  none BMD:   none TDaP: 2011 Shingles: no Pneumonia: unsure Hep C and HIV: HIV neg 2011 Labs: yes if needed   reports that she has never smoked. She has never used smokeless tobacco. She reports that she drinks alcohol. She reports that she does not use drugs.  Past Medical History:  Diagnosis Date  . Anxiety   . Depression   . Heel fracture   . History of ITP   . 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 CHOLECYSTECTOMY  2010  . GALLBLADDER SURGERY  01/2009  . SPLENECTOMY  05/2010  . 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 fexofenadine (ALLEGRA) 180 MG tablet Take 180 mg by mouth daily.    . Levonorgestrel-Ethinyl Estradiol (AMETHIA,CAMRESE) 0.15-0.03 &0.01 MG tablet Take 1 tablet by mouth daily. 3 Package 4   No current facility-administered medications for this visit.     Family History  Problem Relation Age of Onset  . Hypertension Mother   . Heart disease Father   . Stroke Paternal Grandfather   . Hypertension Maternal Grandmother   . Hypertension Maternal Grandfather   . Heart disease Maternal Grandfather     ROS:  Pertinent items are noted in HPI.  Otherwise, a comprehensive ROS was negative.  Exam:    BP 120/80   Pulse 70   Resp 16   Ht 5' 5.5" (1.664 m)   Wt 167 lb (75.8 kg)   LMP 07/17/2017   BMI 27.37 kg/m  Height: 5' 5.5" (166.4 cm) Ht Readings from Last 3 Encounters:  09/18/17 5' 5.5" (1.664 m)  09/17/17 5\' 6"  (1.676 m)  09/03/17 5\' 6"  (1.676 m)    General appearance: alert, cooperative and appears stated age Head: Normocephalic, without obvious abnormality, atraumatic Neck: no adenopathy, supple, symmetrical, trachea midline and thyroid normal to inspection and palpation Lungs: clear to auscultation bilaterally Breasts: normal appearance, no masses or tenderness, No nipple retraction or dimpling, No nipple discharge or bleeding, No axillary or supraclavicular adenopathy Heart: regular rate and rhythm Abdomen: soft, non-tender; no masses,  no organomegaly Extremities: extremities normal, atraumatic, no cyanosis or edema Skin: Skin color, texture, turgor normal. No rashes or lesions Lymph nodes: Cervical, supraclavicular, and axillary nodes normal. No abnormal inguinal nodes palpated Neurologic: Grossly normal   Pelvic: External genitalia:  no lesions              Urethra:  normal appearing urethra with no masses, tenderness or lesions              Bartholin's and Skene's: normal                 Vagina: normal appearing  vagina with normal color and discharge, no lesions              Cervix: no cervical motion tenderness, no lesions and nulliparous appearance              Pap taken: Yes.   Bimanual Exam:  Uterus:  normal size, contour, position, consistency, mobility, non-tender and anteverted              Adnexa: normal adnexa and no mass, fullness, tenderness               Rectovaginal: Confirms               Anus:  normal sphincter tone,  Chaperone present: yes  A:  Well Woman with normal exam  Contraception OCP desired  History of ITTP  Stress fracture of left heel under MD follow up  P:   Reviewed health and wellness pertinent to  exam  Risks/benefits/warning signs of OCP reviewed, desires  Rx.Amethia see order with instructions  Lab: CBC  Continue follow up with MD as indicated  Pap smear: yes   counseled on breast self exam, use and side effects of OCP's, adequate intake of calcium and vitamin D, diet and exercise  return annually or prn  An After Visit Summary was printed and given to the patient.

## 2017-09-18 NOTE — Patient Instructions (Signed)

## 2017-09-19 LAB — CBC
HEMATOCRIT: 42.5 % (ref 34.0–46.6)
Hemoglobin: 14.6 g/dL (ref 11.1–15.9)
MCH: 30.4 pg (ref 26.6–33.0)
MCHC: 34.4 g/dL (ref 31.5–35.7)
MCV: 88 fL (ref 79–97)
PLATELETS: 375 10*3/uL (ref 150–379)
RBC: 4.81 x10E6/uL (ref 3.77–5.28)
RDW: 13.8 % (ref 12.3–15.4)
WBC: 7.9 10*3/uL (ref 3.4–10.8)

## 2017-09-19 LAB — CYTOLOGY - PAP: DIAGNOSIS: NEGATIVE

## 2017-09-20 ENCOUNTER — Encounter: Payer: Self-pay | Admitting: Family Medicine

## 2017-09-20 NOTE — Progress Notes (Signed)
PCP: Patient, No Pcp Per  Subjective:   HPI: Patient is a 27 y.o. female here for heel pain.  3/27: Ana Becker is a 27 y/o woman here for evaluation of left heel pain since last Monday (3/18). She was training for an upcoming half marathon and had recently increased her running intensity to about 26 miles/week and more speed training over the past 3 weeks. Her pain is present throughout the day but bothers her most with weightbearing and walking. She has avoided running on the heel since last Sunday due to the pain. She saw Dr. Leretha Pol at [facility] clinic yesterday who referred her for sports medicine evaluation of suspected heel stress fracture. Xrays were checked at that visit showing no obvious abnormality. She was prescribed tramadol for pain but has not started taking this, she is using crutches to avoid painful walking which help. She has never suffered a previous stress fracture and has never injured her left foot before.  No skin changes, numbness.  Not taking calcium or vitamin D.  Has regular menses.    4/10: Patient reports she's doing well. Gets some pain if walking without the boot up to 3/10 level. Otherwise no pain in the boot. Taking calcium and vitamin D now. Not requiring other medication. No skin changes.  Past Medical History:  Diagnosis Date  . Anxiety   . Depression   . Heel fracture   . History of ITP   . ITP (idiopathic thrombocytopenic purpura) 2011   Spleenectomy     No current outpatient medications on file prior to visit.   No current facility-administered medications on file prior to visit.     Past Surgical History:  Procedure Laterality Date  . CHOLECYSTECTOMY  2010  . GALLBLADDER SURGERY  01/2009  . SPLENECTOMY  05/2010  . 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 Marital status: Married    Spouse name: Not on file  . Number of children: Not on file  . Years of  education: Not on file  . Highest education level: Not on file  Occupational History  . Not on file  Social Needs  . Financial resource strain: Not on file  . Food insecurity:    Worry: Not on file    Inability: Not on file  . Transportation needs:    Medical: Not on file    Non-medical: Not on file  Tobacco Use  . Smoking status: Never Smoker  . Smokeless tobacco: Never Used  Substance and Sexual Activity  . Alcohol use: Yes    Alcohol/week: 0.0 - 0.6 oz  . Drug use: No  . Sexual activity: Yes    Partners: Male    Birth control/protection: Pill  Lifestyle  . Physical activity:    Days per week: Not on file    Minutes per session: Not on file  . Stress: Not on file  Relationships  . Social connections:    Talks on phone: Not on file    Gets together: Not on file    Attends religious service: Not on file    Active member of club or organization: Not on file    Attends meetings of clubs or organizations: Not on file    Relationship status: Not on file  . Intimate partner violence:    Fear of current or ex partner: Not on file    Emotionally abused: Not on file  Physically abused: Not on file    Forced sexual activity: Not on file  Other Topics Concern  . Not on file  Social History Narrative  . Not on file    Family History  Problem Relation Age of Onset  . Hypertension Mother   . Heart disease Father   . Stroke Paternal Grandfather   . Hypertension Maternal Grandmother   . Hypertension Maternal Grandfather   . Heart disease Maternal Grandfather     BP (!) 125/93   Ht 5\' 6"  (1.676 m)   Wt 160 lb (72.6 kg)   BMI 25.82 kg/m   Review of Systems: See HPI above.     Objective:  Physical Exam:  Gen: NAD, comfortable in exam room  Left foot/ankle: No gross deformity, swelling, ecchymoses FROM with 5/5 strength all directions. Minimal TTP medial calcaneus. No tenderness calcaneal squeeze. Negative ant drawer and talar tilt.   NV intact distally.    Assessment & Plan:  1. Left heel pain - 2/2 calcaneal stress fracture.  Use cam walker for 2 more weeks then switch to supportive shoe.  No running for 4 weeks - follow up with me then.  Wait a week before elliptical, 2 weeks before closed chain exercises.  Ok for cycling, swimming if not painful.  Calcium, vitamin D.  Tylenol only if needed.

## 2017-09-20 NOTE — Assessment & Plan Note (Signed)
Use cam walker for 2 more weeks then switch to supportive shoe.  No running for 4 weeks - follow up with me then.  Wait a week before elliptical, 2 weeks before closed chain exercises.  Ok for cycling, swimming if not painful.  Calcium, vitamin D.  Tylenol only if needed.

## 2017-10-15 ENCOUNTER — Ambulatory Visit (INDEPENDENT_AMBULATORY_CARE_PROVIDER_SITE_OTHER): Payer: BC Managed Care – PPO | Admitting: Family Medicine

## 2017-10-15 DIAGNOSIS — M84375D Stress fracture, left foot, subsequent encounter for fracture with routine healing: Secondary | ICD-10-CM | POA: Diagnosis not present

## 2017-10-15 NOTE — Patient Instructions (Signed)
You're doing great! Use inserts with exercise (spencos, dr. Jari Sportsman active series, the comforthotics from [employer]). Continue calcium and vitamin D. Follow up with me in 6 weeks or as needed. Start UJW:JXBJ program every other day 1. 1:1 for 10 minutes 2. 2:1 for 15 minutes 3. 3:1 for 20 minutes, etc After 2 weeks you can go to jogging without walk interval.

## 2017-10-16 ENCOUNTER — Encounter: Payer: Self-pay | Admitting: Family Medicine

## 2017-10-16 NOTE — Progress Notes (Signed)
PCP: Patient, No Pcp Per  Subjective:   HPI: Patient is a 27 y.o. female here for heel pain.  3/27: Ana Becker is a 27 y/o woman here for evaluation of left heel pain since last Monday (3/18). She was training for an upcoming half marathon and had recently increased her running intensity to about 26 miles/week and more speed training over the past 3 weeks. Her pain is present throughout the day but bothers her most with weightbearing and walking. She has avoided running on the heel since last Sunday due to the pain. She saw Dr. Leretha Pol at [facility] Medicine clinic yesterday who referred her for sports medicine evaluation of suspected heel stress fracture. Xrays were checked at that visit showing no obvious abnormality. She was prescribed tramadol for pain but has not started taking this, she is using crutches to avoid painful walking which help. She has never suffered a previous stress fracture and has never injured her left foot before.  No skin changes, numbness.  Not taking calcium or vitamin D.  Has regular menses.    4/10: Patient reports she's doing well. Gets some pain if walking without the boot up to 3/10 level. Otherwise no pain in the boot. Taking calcium and vitamin D now. Not requiring other medication. No skin changes.  5/8: Patient reports that she is doing well. She has not tried running yet. She has no pain currently but does get some soreness at the end of the day in her left heel. No skin changes or numbness. She has had about 1 week of pain at the base of her left big toe also without any swelling, redness. No history of gout and no acute injury.  Past Medical History:  Diagnosis Date  . Anxiety   . Depression   . Heel fracture   . History of ITP   . ITP (idiopathic thrombocytopenic purpura) 2011   Spleenectomy     Current Outpatient Medications on File Prior to Visit  Medication Sig Dispense Refill  . fexofenadine (ALLEGRA) 180 MG tablet Take 180 mg by mouth  daily.    . Levonorgestrel-Ethinyl Estradiol (AMETHIA,CAMRESE) 0.15-0.03 &0.01 MG tablet Take 1 tablet by mouth daily. 3 Package 4   No current facility-administered medications on file prior to visit.     Past Surgical History:  Procedure Laterality Date  . CHOLECYSTECTOMY  2010  . GALLBLADDER SURGERY  01/2009  . SPLENECTOMY  05/2010  . 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 Marital status: Married    Spouse name: Not on file  . Number of children: Not on file  . Years of education: Not on file  . Highest education level: Not on file  Occupational History  . Not on file  Social Needs  . Financial resource strain: Not on file  . Food insecurity:    Worry: Not on file    Inability: Not on file  . Transportation needs:    Medical: Not on file    Non-medical: Not on file  Tobacco Use  . Smoking status: Never Smoker  . Smokeless tobacco: Never Used  Substance and Sexual Activity  . Alcohol use: Yes    Alcohol/week: 0.0 - 0.6 oz  . Drug use: No  . Sexual activity: Yes    Partners: Male    Birth control/protection: Pill  Lifestyle  . Physical activity:    Days per week: Not on  file    Minutes per session: Not on file  . Stress: Not on file  Relationships  . Social connections:    Talks on phone: Not on file    Gets together: Not on file    Attends religious service: Not on file    Active member of club or organization: Not on file    Attends meetings of clubs or organizations: Not on file    Relationship status: Not on file  . Intimate partner violence:    Fear of current or ex partner: Not on file    Emotionally abused: Not on file    Physically abused: Not on file    Forced sexual activity: Not on file  Other Topics Concern  . Not on file  Social History Narrative  . Not on file    Family History  Problem Relation Age of Onset  . Hypertension Mother   . Heart disease Father   . Stroke  Paternal Grandfather   . Hypertension Maternal Grandmother   . Hypertension Maternal Grandfather   . Heart disease Maternal Grandfather     BP 120/86   Ht  (1.676 m)   Wt 160 lb (72.6 kg)   BMI 25.82 kg/m   Review of Systems: See HPI above.     Objective:  Physical Exam:  Gen: NAD, comfortable in exam room  Left foot/ankle: No gross deformity, swelling, ecchymoses FROM with 5/5 strength all directions without pain. No TTP heel.  Mild TTP dorsolateral aspect of 1st MTP Negative calcaneal squeeze. Negative ant drawer and talar tilt.   NV intact distally.   Assessment & Plan:  1. Left heel pain - 2/2 calcaneal stress fracture, clinically improved.  Discussed insoles to decrease compressive and rotational forces on foot/ankle.  Calcium, vitamin D.  Reviewed walk:jog program and how to progress this.  F/u prn.  Tylenol if needed for soreness.  Advised we monitor the pain she's having at base of 1st MTP on the left - no evidence gout, turf toe.

## 2017-10-16 NOTE — Assessment & Plan Note (Signed)
2/2 calcaneal stress fracture, clinically improved.  Discussed insoles to decrease compressive and rotational forces on foot/ankle.  Calcium, vitamin D.  Reviewed walk:jog program and how to progress this.  F/u prn.  Tylenol if needed for soreness.

## 2018-08-17 ENCOUNTER — Encounter: Payer: Self-pay | Admitting: Emergency Medicine

## 2018-08-17 ENCOUNTER — Ambulatory Visit: Payer: BC Managed Care – PPO | Admitting: Emergency Medicine

## 2018-08-17 ENCOUNTER — Other Ambulatory Visit: Payer: Self-pay

## 2018-08-17 ENCOUNTER — Ambulatory Visit (INDEPENDENT_AMBULATORY_CARE_PROVIDER_SITE_OTHER): Payer: BC Managed Care – PPO

## 2018-08-17 VITALS — BP 139/83 | HR 70 | Temp 99.5°F | Resp 18 | Ht 66.0 in | Wt 168.8 lb

## 2018-08-17 DIAGNOSIS — S99922A Unspecified injury of left foot, initial encounter: Secondary | ICD-10-CM

## 2018-08-17 DIAGNOSIS — M79672 Pain in left foot: Secondary | ICD-10-CM

## 2018-08-17 DIAGNOSIS — S93602A Unspecified sprain of left foot, initial encounter: Secondary | ICD-10-CM | POA: Diagnosis not present

## 2018-08-17 NOTE — Progress Notes (Signed)
Ana Becker 28 y.o.   Chief Complaint  Patient presents with  . Foot Pain    left foot pain sprained ankle last sunday says ankle got better but having pain on outside of foot on the bone     HISTORY OF PRESENT ILLNESS: This is a 28 y.o. female complaining of pain to her left foot since she sprained left ankle 2 days ago.  HPI   Prior to Admission medications   Medication Sig Start Date End Date Taking? Authorizing Provider  fexofenadine (ALLEGRA) 180 MG tablet Take 180 mg by mouth daily.   Yes [provider]  Levonorgestrel-Ethinyl Estradiol (AMETHIA,CAMRESE) 0.15-0.03 &0.01 MG tablet Take 1 tablet by mouth daily. 09/18/17  Yes Verner Chol, CNM    No Known Allergies  Patient Active Problem List   Diagnosis Date Noted  . Stress fracture of calcaneus, left, with routine healing, subsequent encounter 09/03/2017    Past Medical History:  Diagnosis Date  . Anxiety   . Depression   . Heel fracture   . History of ITP   . 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 CHOLECYSTECTOMY  2010  . GALLBLADDER SURGERY  01/2009  . SPLENECTOMY  05/2010  . 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 Marital status: Married    Spouse name: Not on file  . Number of children: Not on file  . Years of education: Not on file  . Highest education level: Not on file  Occupational History  . Not on file  Social Needs  . Financial resource strain: Not on file  . Food insecurity:    Worry: Not on file    Inability: Not on file  . Transportation needs:    Medical: Not on file    Non-medical: Not on file  Tobacco Use  . Smoking status: Never Smoker  . Smokeless tobacco: Never Used  Substance and Sexual Activity  . Alcohol use: Yes    Alcohol/week: 0.0 - 1.0 standard drinks  . Drug use: No  . Sexual activity: Yes    Partners: Male    Birth  control/protection: Pill  Lifestyle  . Physical activity:    Days per week: Not on file    Minutes per session: Not on file  . Stress: Not on file  Relationships  . Social connections:    Talks on phone: Not on file    Gets together: Not on file    Attends religious service: Not on file    Active member of club or organization: Not on file    Attends meetings of clubs or organizations: Not on file    Relationship status: Not on file  . Intimate partner violence:    Fear of current or ex partner: Not on file    Emotionally abused: Not on file    Physically abused: Not on file    Forced sexual activity: Not on file  Other Topics Concern  . Not on file  Social History Narrative  . Not on file    Family History  Problem Relation Age of Onset  . Hypertension Mother   . Heart disease Father   . Stroke Paternal Grandfather   . Hypertension Maternal Grandmother   . Hypertension Maternal Grandfather   . Heart disease Maternal Grandfather      Review of Systems  Constitutional: Negative  for chills and fever.  Respiratory: Negative for shortness of breath.   Gastrointestinal: Negative for nausea and vomiting.  Musculoskeletal:       Left foot pain  Skin: Negative.  Negative for rash.  Neurological: Negative for dizziness and headaches.  Endo/Heme/Allergies: Negative.   All other systems reviewed and are negative.   Vitals:   08/17/18 1515  BP: 139/83  Pulse: 70  Resp: 18  Temp: 99.5 F (37.5 C)  SpO2: 97%    Physical Exam Vitals signs reviewed.  Constitutional:      Appearance: Normal appearance.  HENT:     Head: Normocephalic and atraumatic.  Eyes:     Extraocular Movements: Extraocular movements intact.     Pupils: Pupils are equal, round, and reactive to light.  Neck:     Musculoskeletal: Normal range of motion.  Cardiovascular:     Rate and Rhythm: Normal rate and regular rhythm.  Pulmonary:     Effort: Pulmonary effort is normal.  Musculoskeletal:      Comments: Left foot: Mild tenderness along fifth metatarsal bone.  No significant swelling or ecchymosis.  Full range of motion. Left ankle: Full range of motion.  No swelling.  No tenderness within normal limits  Skin:    General: Skin is warm and dry.  Neurological:     General: No focal deficit present.     Mental Status: She is alert and oriented to person, place, and time.  Psychiatric:        Mood and Affect: Mood normal.        Behavior: Behavior normal.    Dg Foot Complete Left  Result Date: 08/17/2018 CLINICAL DATA:  Left foot injury. EXAM: LEFT FOOT - COMPLETE 3+ VIEW COMPARISON:  09/02/2017 FINDINGS: There is no evidence of fracture or dislocation. There is no evidence of arthropathy or other focal bone abnormality. Soft tissues are unremarkable. IMPRESSION: Negative. Electronically Signed   By: Ted Mcalpine M.D.   On: 08/17/2018 15:51     ASSESSMENT & PLAN: Ana Becker was seen today for foot pain.  Diagnoses and all orders for this visit:  Left foot pain -     DG Foot Complete Left; Future  Injury of left foot, initial encounter -     DG Foot Complete Left; Future  Sprain of left foot, initial encounter    Patient Instructions       If you have lab work done today you will be contacted with your lab results within the next 2 weeks.  If you have not heard from [place] then please contact us. The fastest way to get your results is to register for My Chart.   IF you received an x-ray today, you will receive an invoice from [facility]. Please contact [facility] at [phone] with questions or concerns regarding your invoice.   IF you received labwork today, you will receive an invoice from [facility]. Please contact [facility] at [phone] with questions or concerns regarding your invoice.   Our billing staff will not be able to assist you with questions regarding bills from these companies.  You will be contacted with the lab results as soon  as they are available. The fastest way to get your results is to activate your My Chart account. Instructions are located on the last page of this paperwork. If you have not heard from [place] regarding the results in 2 weeks, please contact this office.     Foot Sprain  A foot sprain is an injury  to one of the strong bands of tissue (ligaments) that connect and support the bones in your feet. The ligament can be stretched too much and tear. A tear can be either partial or complete. The severity of the sprain depends on how much of the ligament was damaged or torn. What are the causes? This condition is usually caused by suddenly twisting or pivoting your foot. What increases the risk? This injury is more likely to occur in people who:  Play a sport, such as basketball or football.  Exercise or play a sport without warming up.  Start a new workout or sport.  Suddenly increase how long or hard they exercise or play a sport.  Have previously injured their foot or ankle. What are the signs or symptoms? Symptoms of this condition start soon after an injury and include:  Pain, especially in the arch of the foot.  Bruising.  Swelling.  Inability to walk or use the foot to support body weight. How is this diagnosed? This condition is diagnosed with a medical history and physical exam. You may also have imaging tests, such as:  X-rays to make sure there are no broken bones (fractures).  An MRI to see if the ligament is torn. How is this treated? Treatment for this condition depends on the severity of the sprain.  Mild sprains can be treated with: ? Rest, ice, compression, and elevation (RICE). ? Keeping your foot in a fixed position (immobilization) for a period of time. This is done if your ligament is overstretched or partially torn. Your health care provider will apply a bandage, splint, or walking boot to keep your foot from moving until it heals. ? Using crutches or a scooter for a  few weeks to avoid putting weight on your foot while it is healing.  Major sprains can be treated with: ? Surgery. This is done if your ligament is fully torn and a procedure is needed to reconnect it to the bone. ? A cast or splint. This will be needed after surgery. A cast or splint will need to stay on your foot while it heals.  In both types of sprains, you may need to exercise or have physical therapy to strengthen your foot. Follow these instructions at home: If you have a bandage, splint, or boot:  Wear the bandage, splint, or boot as told by your health care provider. Remove only as told by your health care provider.  Loosen the bandage, splint, or boot if your toes tingle, become numb, or turn cold and blue.  Keep the bandage, splint, or boot clean and dry. If you have a cast:  Do not stick anything inside the cast to scratch your skin. Doing that increases your risk for infection.  Check the skin around the cast every day. Tell your health care provider about any concerns.  You may put lotion on dry skin around the edges of the cast. Do not put lotion on the skin underneath the cast.  Keep the cast clean and dry. Bathing  Do not take baths, swim, or use a hot tub until your health care provider approves. Ask your health care provider if you may take showers. You may only be allowed to take sponge baths.  If the bandage, splint, boot, or cast is not waterproof: ? Do not let it get wet. ? Cover it with a watertight covering when you take a shower. Managing pain, stiffness, and swelling   If directed, put ice on the injured  area: ? If you have a removable splint, boot, or immobilizer, remove it as told by your health care provider. ? Put ice in a plastic bag. ? Place a towel between your skin and the bag. ? Leave the ice on for 20 minutes, 2-3 times per day.  Move your toes often to avoid stiffness and to lessen swelling.  Raise (elevate) the injured area above the  level of your heart while you are sitting or lying down. Driving  Do not drive or operate heavy machinery while taking pain medicine.  Ask your health care provider when it is safe to drive if you have a bandage, splint, or walking boot on your foot. Activity  Do not use the injured foot to support your body weight until your health care provider says that you can. Use crutches or other supportive devices as directed by your health care provider.  Ask your health care provider what activities are safe for you. Do any exercise or physical therapy as directed.  Gradually increase how much and how far you walk until your health care provider says it is safe to return to full activity. General instructions  If you have a cast, do not put pressure on any part of it until it is fully hardened. This may take several hours.  Take over-the-counter and prescription medicines only as told by your health care provider.  When you can walk without pain, wear supportive shoes that have stiff soles. Do not wear flip-flops, and do not walk barefoot.  Keep all follow-up visits as told by your health care provider. This is important. Contact a health care provider if:  Your pain is not controlled with medicine.  Your bruising or swelling gets worse or does not get better with treatment.  Your splint, boot, or cast is damaged. Get help right away if:  You develop severe numbness or tingling in your foot.  Your foot turns blue, white, or gray, and it feels cold. Summary  A foot sprain is an injury to one of the strong bands of tissue (ligaments) that connect and support the bones in your feet.  Your health care provider may recommend a splint or boot for your foot to support it while it heals. In some cases, surgery may be needed.  Physical therapy can help keep your other muscles strong until your foot gets better. This information is not intended to replace advice given to you by your health  care provider. Make sure you discuss any questions you have with your health care provider. Document Released: 11/16/2001 Document Revised: 05/31/2017 Document Reviewed: 05/31/2017 Elsevier Interactive Patient Education  2019 [employer]      Edwina Barth, MD [facility]

## 2018-08-17 NOTE — Patient Instructions (Addendum)
   If you have lab work done today you will be contacted with your lab results within the next 2 weeks.  If you have not heard from us then please contact us. The fastest way to get your results is to register for My Chart.   IF you received an x-ray today, you will receive an invoice from [facility]. Please contact [facility] at [phone] with questions or concerns regarding your invoice.   IF you received labwork today, you will receive an invoice from [facility]. Please contact [facility] at [phone] with questions or concerns regarding your invoice.   Our billing staff will not be able to assist you with questions regarding bills from these companies.  You will be contacted with the lab results as soon as they are available. The fastest way to get your results is to activate your My Chart account. Instructions are located on the last page of this paperwork. If you have not heard from us regarding the results in 2 weeks, please contact this office.      Foot Sprain  A foot sprain is an injury to one of the strong bands of tissue (ligaments) that connect and support the bones in your feet. The ligament can be stretched too much and tear. A tear can be either partial or complete. The severity of the sprain depends on how much of the ligament was damaged or torn. What are the causes? This condition is usually caused by suddenly twisting or pivoting your foot. What increases the risk? This injury is more likely to occur in people who:  Play a sport, such as basketball or football.  Exercise or play a sport without warming up.  Start a new workout or sport.  Suddenly increase how long or hard they exercise or play a sport.  Have previously injured their foot or ankle. What are the signs or symptoms? Symptoms of this condition start soon after an injury and include:  Pain, especially in the arch of the foot.  Bruising.  Swelling.  Inability to walk or  use the foot to support body weight. How is this diagnosed? This condition is diagnosed with a medical history and physical exam. You may also have imaging tests, such as:  X-rays to make sure there are no broken bones (fractures).  An MRI to see if the ligament is torn. How is this treated? Treatment for this condition depends on the severity of the sprain.  Mild sprains can be treated with: ? Rest, ice, compression, and elevation (RICE). ? Keeping your foot in a fixed position (immobilization) for a period of time. This is done if your ligament is overstretched or partially torn. Your health care provider will apply a bandage, splint, or walking boot to keep your foot from moving until it heals. ? Using crutches or a scooter for a few weeks to avoid putting weight on your foot while it is healing.  Major sprains can be treated with: ? Surgery. This is done if your ligament is fully torn and a procedure is needed to reconnect it to the bone. ? A cast or splint. This will be needed after surgery. A cast or splint will need to stay on your foot while it heals.  In both types of sprains, you may need to exercise or have physical therapy to strengthen your foot. Follow these instructions at home: If you have a bandage, splint, or boot:  Wear the bandage, splint, or boot as told by your health   care provider. Remove only as told by your health care provider.  Loosen the bandage, splint, or boot if your toes tingle, become numb, or turn cold and blue.  Keep the bandage, splint, or boot clean and dry. If you have a cast:  Do not stick anything inside the cast to scratch your skin. Doing that increases your risk for infection.  Check the skin around the cast every day. Tell your health care provider about any concerns.  You may put lotion on dry skin around the edges of the cast. Do not put lotion on the skin underneath the cast.  Keep the cast clean and dry. Bathing  Do not take  baths, swim, or use a hot tub until your health care provider approves. Ask your health care provider if you may take showers. You may only be allowed to take sponge baths.  If the bandage, splint, boot, or cast is not waterproof: ? Do not let it get wet. ? Cover it with a watertight covering when you take a shower. Managing pain, stiffness, and swelling   If directed, put ice on the injured area: ? If you have a removable splint, boot, or immobilizer, remove it as told by your health care provider. ? Put ice in a plastic bag. ? Place a towel between your skin and the bag. ? Leave the ice on for 20 minutes, 2-3 times per day.  Move your toes often to avoid stiffness and to lessen swelling.  Raise (elevate) the injured area above the level of your heart while you are sitting or lying down. Driving  Do not drive or operate heavy machinery while taking pain medicine.  Ask your health care provider when it is safe to drive if you have a bandage, splint, or walking boot on your foot. Activity  Do not use the injured foot to support your body weight until your health care provider says that you can. Use crutches or other supportive devices as directed by your health care provider.  Ask your health care provider what activities are safe for you. Do any exercise or physical therapy as directed.  Gradually increase how much and how far you walk until your health care provider says it is safe to return to full activity. General instructions  If you have a cast, do not put pressure on any part of it until it is fully hardened. This may take several hours.  Take over-the-counter and prescription medicines only as told by your health care provider.  When you can walk without pain, wear supportive shoes that have stiff soles. Do not wear flip-flops, and do not walk barefoot.  Keep all follow-up visits as told by your health care provider. This is important. Contact a health care provider  if:  Your pain is not controlled with medicine.  Your bruising or swelling gets worse or does not get better with treatment.  Your splint, boot, or cast is damaged. Get help right away if:  You develop severe numbness or tingling in your foot.  Your foot turns blue, white, or gray, and it feels cold. Summary  A foot sprain is an injury to one of the strong bands of tissue (ligaments) that connect and support the bones in your feet.  Your health care provider may recommend a splint or boot for your foot to support it while it heals. In some cases, surgery may be needed.  Physical therapy can help keep your other muscles strong until your foot gets better.   information is not intended to replace advice given to you by your health care provider. Make sure you discuss any questions you have with your health care provider. Document Released: 11/16/2001 Document Revised: 05/31/2017 Document Reviewed: 05/31/2017 Elsevier Interactive Patient Education  2019 [employer].

## 2018-09-23 ENCOUNTER — Ambulatory Visit: Payer: BC Managed Care – PPO | Admitting: Certified Nurse Midwife

## 2018-11-04 ENCOUNTER — Telehealth: Payer: Self-pay | Admitting: Certified Nurse Midwife

## 2018-11-04 NOTE — Telephone Encounter (Signed)
Left message on voicemail to call and reschedule cancelled appointment. °

## 2018-11-18 IMAGING — DX DG FOOT COMPLETE 3+V*L*
3 series · 3 of 3 positions shown · non-contrast
Comparison: No recent prior.

CLINICAL DATA: Left heel pain for 1 week.  Methadone runner.

EXAM:
LEFT FOOT - COMPLETE 3+ VIEW

[foot ap]
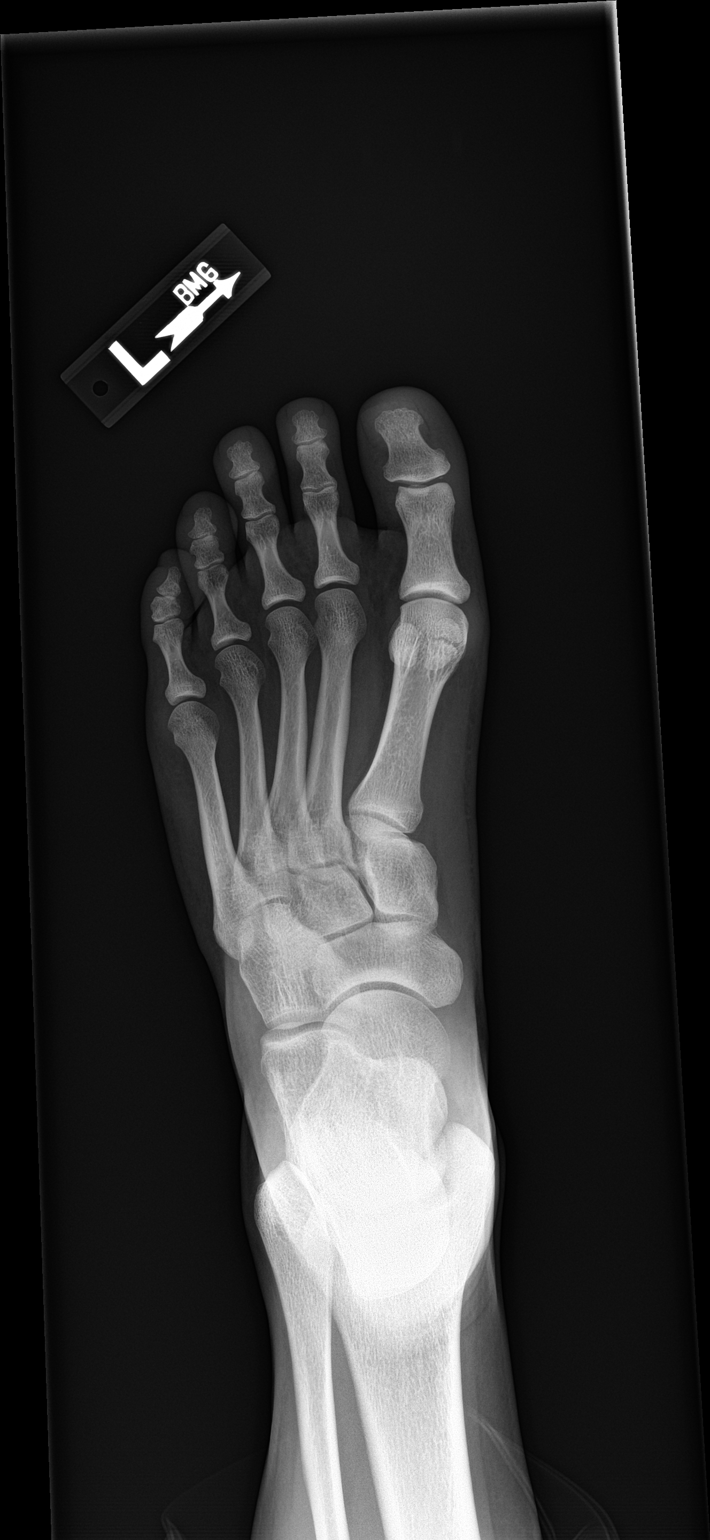

[foot obl]
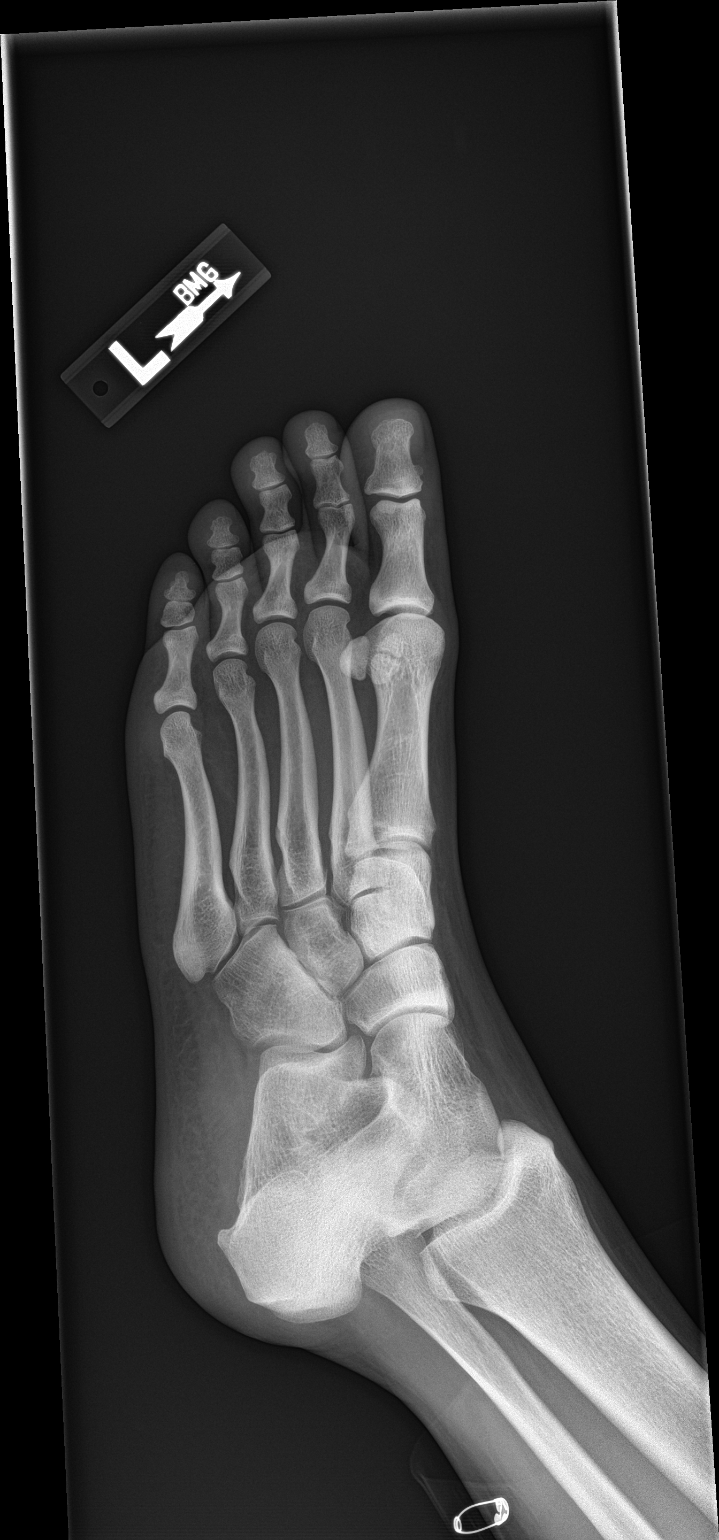

[foot lat]
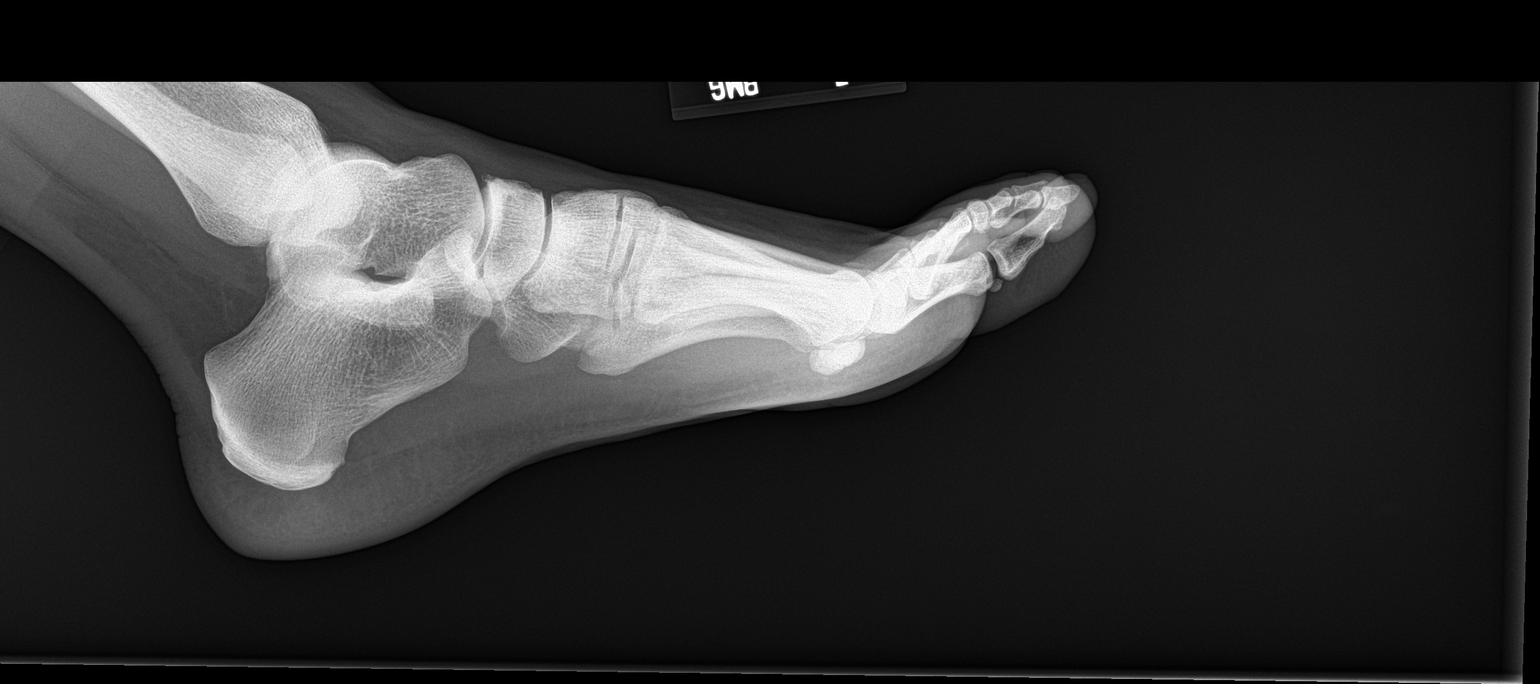

[3 of 3 positions shown; findings below may reference images not displayed]

FINDINGS: No acute bony or joint abnormality. No evidence of fracture. No
evidence of dislocation. Soft tissue structures unremarkable.
IMPRESSION: No acute abnormality.

## 2018-12-04 ENCOUNTER — Ambulatory Visit: Payer: BC Managed Care – PPO | Admitting: Certified Nurse Midwife

## 2018-12-04 ENCOUNTER — Other Ambulatory Visit: Payer: Self-pay

## 2018-12-04 ENCOUNTER — Other Ambulatory Visit: Payer: Self-pay | Admitting: Certified Nurse Midwife

## 2018-12-04 ENCOUNTER — Encounter: Payer: Self-pay | Admitting: Certified Nurse Midwife

## 2018-12-04 VITALS — BP 120/78 | HR 68 | Temp 97.6°F | Resp 16 | Ht 65.75 in | Wt 167.0 lb

## 2018-12-04 DIAGNOSIS — Z01419 Encounter for gynecological examination (general) (routine) without abnormal findings: Secondary | ICD-10-CM

## 2018-12-04 DIAGNOSIS — Z0000 Encounter for general adult medical examination without abnormal findings: Secondary | ICD-10-CM

## 2018-12-04 DIAGNOSIS — Z862 Personal history of diseases of the blood and blood-forming organs and certain disorders involving the immune mechanism: Secondary | ICD-10-CM

## 2018-12-04 DIAGNOSIS — Z3041 Encounter for surveillance of contraceptive pills: Secondary | ICD-10-CM

## 2018-12-04 MED ORDER — LEVONORGEST-ETH ESTRAD 91-DAY 0.15-0.03 &0.01 MG PO TABS: 1.0000 | ORAL_TABLET | Freq: Every day | ORAL | 4 refills | Status: DC

## 2018-12-04 NOTE — Progress Notes (Signed)
28 y.o. G0P0000 Married  Caucasian Fe here for annual exam. Periods normal, no issues. Contraception working well with OCP, no warning signs noted. Stress fracture healed well and now back running. Stop rowing exercise, due to pain with use. Saw MD and all fine. Sees PCP prn. Screening labs if needed. No other health issue today.   LMP: 10/2018 Sexually active: Yes.    The current method of family planning is OCP (estrogen/progesterone).    Exercising: Yes.    running Smoker:  no  Review of Systems  Constitutional: Negative.   HENT: Negative.   Eyes: Negative.   Respiratory: Negative.   Cardiovascular: Negative.   Gastrointestinal: Negative.   Genitourinary: Negative.   Musculoskeletal: Negative.   Skin: Negative.   Neurological: Negative.   Endo/Heme/Allergies: Negative.   Psychiatric/Behavioral: Negative.     Health Maintenance: Pap:  09-12-14 neg, 09-18-17 neg History of Abnormal Pap: no MMG:  none Self Breast exams: yes Colonoscopy:  none BMD:   none TDaP:  2011 Shingles: no Pneumonia: unsure Hep C and HIV: HIV neg 2011 Labs: if needed   reports that she has never smoked. She has never used smokeless tobacco. She reports current alcohol use. She reports that she does not use drugs.  Past Medical History:  Diagnosis Date  . Anxiety   . Depression   . Heel fracture   . History of ITP   . 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 CHOLECYSTECTOMY  2010  . GALLBLADDER SURGERY  01/2009  . SPLENECTOMY  05/2010  . 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 fexofenadine (ALLEGRA) 180 MG tablet Take 180 mg by mouth daily.    . Levonorgestrel-Ethinyl Estradiol (AMETHIA,CAMRESE) 0.15-0.03 &0.01 MG tablet Take 1 tablet by mouth daily. 3 Package 4   No current facility-administered medications for this visit.     Family  History  Problem Relation Age of Onset  . Hypertension Mother   . Heart disease Father   . Stroke Paternal Grandfather   . Hypertension Maternal Grandmother   . Hypertension Maternal Grandfather   . Heart disease Maternal Grandfather     ROS:  Pertinent items are noted in HPI.  Otherwise, a comprehensive ROS was negative.  Exam:   There were no vitals taken for this visit.   Ht Readings from Last 3 Encounters:  08/17/18 5\' 6"  (1.676 m)  10/15/17 5\' 6"  (1.676 m)  09/18/17 5' 5.5" (1.664 m)    General appearance: alert, cooperative and appears stated age Head: Normocephalic, without obvious abnormality, atraumatic Neck: no adenopathy, supple, symmetrical, trachea midline and thyroid normal to inspection and palpation Lungs: clear to auscultation bilaterally Breasts: normal appearance, no masses or tenderness, No nipple retraction or dimpling, No nipple discharge or bleeding, No axillary or supraclavicular adenopathy Heart: regular rate and rhythm Abdomen: soft, non-tender; no masses,  no organomegaly Extremities: extremities normal, atraumatic, no cyanosis or edema Skin: Skin color, texture, turgor normal. No rashes or lesions Lymph nodes: Cervical, supraclavicular, and axillary nodes normal. No abnormal inguinal nodes palpated Neurologic: Grossly normal   Pelvic: External genitalia:  no lesions              Urethra:  normal appearing urethra with no masses, tenderness or lesions              Bartholin's and Skene's: normal  Vagina: normal appearing vagina with normal color and discharge, no lesions              Cervix: no cervical motion tenderness, no lesions and nulliparous appearance              Pap taken: No. Bimanual Exam:  Uterus:  normal size, contour, position, consistency, mobility, non-tender and anteverted              Adnexa: normal adnexa and no mass, fullness, tenderness               Rectovaginal: Confirms               Anus:  normal  appearance  Chaperone present: yes  A:  Well Woman with normal exam  Contraception OCP desired  History ITP  P:   Reviewed health and wellness pertinent to exam  Risks/benefits/warning signs regarding OCP use reviewed.  Rx Amethia see instructions  Labs: CBC, Lipid panel  Pap smear: no   counseled on breast self exam, use and side effects of OCP's, adequate intake of calcium and vitamin D, diet and exercise  return annually or prn  An After Visit Summary was printed and given to the patient.

## 2018-12-05 LAB — CBC
Hematocrit: 41.3 % (ref 34.0–46.6)
Hemoglobin: 14.1 g/dL (ref 11.1–15.9)
MCH: 31.3 pg (ref 26.6–33.0)
MCHC: 34.1 g/dL (ref 31.5–35.7)
MCV: 92 fL (ref 79–97)
Platelets: 383 10*3/uL (ref 150–450)
RBC: 4.51 x10E6/uL (ref 3.77–5.28)
RDW: 13.2 % (ref 11.7–15.4)
WBC: 7.6 10*3/uL (ref 3.4–10.8)

## 2018-12-07 LAB — LIPID PANEL
Chol/HDL Ratio: 3.9 ratio (ref 0.0–4.4)
Cholesterol, Total: 201 mg/dL — ABNORMAL HIGH (ref 100–199)
HDL: 52 mg/dL (ref >39)
LDL Calculated: 123 mg/dL — ABNORMAL HIGH (ref 0–99)
Triglycerides: 132 mg/dL (ref 0–149)
VLDL Cholesterol Cal: 26 mg/dL (ref 5–40)

## 2019-09-01 ENCOUNTER — Encounter: Payer: Self-pay | Admitting: Certified Nurse Midwife

## 2019-12-07 ENCOUNTER — Ambulatory Visit: Payer: BC Managed Care – PPO | Admitting: Certified Nurse Midwife

## 2019-12-09 ENCOUNTER — Ambulatory Visit: Payer: BC Managed Care – PPO | Admitting: Obstetrics and Gynecology

## 2019-12-09 ENCOUNTER — Other Ambulatory Visit (HOSPITAL_COMMUNITY)
Admission: RE | Admit: 2019-12-09 | Discharge: 2019-12-09 | Disposition: A | Discharge status: Home / Self Care | Payer: BC Managed Care – PPO | Source: Ambulatory Visit | Attending: Obstetrics and Gynecology | Admitting: Obstetrics and Gynecology

## 2019-12-09 ENCOUNTER — Other Ambulatory Visit: Payer: Self-pay

## 2019-12-09 ENCOUNTER — Encounter: Payer: Self-pay | Admitting: Obstetrics and Gynecology

## 2019-12-09 VITALS — BP 142/84 | HR 80 | Resp 14 | Ht 65.0 in | Wt 170.8 lb

## 2019-12-09 DIAGNOSIS — Z3041 Encounter for surveillance of contraceptive pills: Secondary | ICD-10-CM | POA: Diagnosis not present

## 2019-12-09 DIAGNOSIS — Z119 Encounter for screening for infectious and parasitic diseases, unspecified: Secondary | ICD-10-CM | POA: Diagnosis not present

## 2019-12-09 DIAGNOSIS — Z01419 Encounter for gynecological examination (general) (routine) without abnormal findings: Secondary | ICD-10-CM | POA: Insufficient documentation

## 2019-12-09 DIAGNOSIS — Z23 Encounter for immunization: Secondary | ICD-10-CM | POA: Diagnosis not present

## 2019-12-09 MED ORDER — LEVONORGEST-ETH ESTRAD 91-DAY 0.15-0.03 &0.01 MG PO TABS: 1.0000 | ORAL_TABLET | Freq: Every day | ORAL | 3 refills | Status: DC

## 2019-12-09 NOTE — Progress Notes (Signed)
29 y.o. G56P0000 Married Caucasian female here for annual exam.    Happy with her birth control pills.  Light menses. Remembers to take her pills.   Takes magnesium, gummy with herbs/melatonin.   No Covid vaccine to date.  From [place].  Work with [employer].  Works in Clinical research associate.  PCP:  None   Patient's last menstrual period was 10/23/2019 (exact date).           Sexually active: Yes.    The current method of family planning is OCP (estrogen/progesterone).    Exercising: Yes.    running Smoker:  no  Health Maintenance: Pap: 09-18-17 neg, 09-12-14 neg History of abnormal Pap:  no MMG:  n/a Colonoscopy:  n/a BMD:   n/a  Result  n/a TDaP:  2011-- would like today Gardasil:   No.  Declines. HIV: 2011 Neg Hep C: today Screening Labs:  PCP.    reports that she has never smoked. She has never used smokeless tobacco. She reports current alcohol use. She reports that she does not use drugs.  Past Medical History:  Diagnosis Date  . Anxiety   . Depression   . Heel fracture   . History of ITP   . 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 CHOLECYSTECTOMY  2010  . GALLBLADDER SURGERY  01/2009  . SPLENECTOMY  05/2010  . 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 Levonorgestrel-Ethinyl Estradiol (AMETHIA) 0.15-0.03 &0.01 MG tablet Take 1 tablet by mouth daily. 3 Package 4  . Triamcinolone Acetonide (NASACORT ALLERGY 24HR NA) Place into the nose.     No current facility-administered medications for this visit.    Family History  Problem Relation Age of Onset  . Hypertension Mother   . Heart disease Father   . Atrial fibrillation Father   . Heart attack Father   . Stroke Paternal Grandfather   . Hypertension Maternal Grandmother   . Hypertension Maternal Grandfather   . Heart disease Maternal Grandfather      Review of Systems  All other systems reviewed and are negative.   Exam:   BP (!) 142/84   Pulse 80   Resp 14   Ht 5\' 5"  (1.651 m)   Wt 170 lb 12.8 oz (77.5 kg)   LMP 10/23/2019 (Exact Date)   BMI 28.42 kg/m     General appearance: alert, cooperative and appears stated age Head: normocephalic, without obvious abnormality, atraumatic Neck: no adenopathy, supple, symmetrical, trachea midline and thyroid normal to inspection and palpation Lungs: clear to auscultation bilaterally Breasts: normal appearance, no masses or tenderness, No nipple retraction or dimpling, No nipple discharge or bleeding, No axillary adenopathy Heart: regular rate and rhythm Abdomen: soft, non-tender; no masses, no organomegaly Extremities: extremities normal, atraumatic, no cyanosis or edema Skin: skin color, texture, turgor normal. No rashes or lesions Lymph nodes: cervical, supraclavicular, and axillary nodes normal. Neurologic: grossly normal  Pelvic: External genitalia:  no lesions              No abnormal inguinal nodes palpated.              Urethra:  normal appearing urethra with no masses, tenderness or lesions              Bartholins and Skenes: normal  Vagina: normal appearing vagina with normal color and discharge, no lesions              Cervix: no lesions              Pap taken: Yes.   Bimanual Exam:  Uterus:  normal size, contour, position, consistency, mobility, non-tender              Adnexa: no mass, fullness, tenderness            Chaperone was present for exam.  Assessment:   Well woman visit with normal exam. Hx ITP.  Hx stress fracture.  Screening for infectious disease.  Plan: Mammogram screening discussed. Self breast awareness reviewed. Pap and HR HPV as above. Guidelines for Calcium, Vitamin D, regular exercise program including cardiovascular and weight bearing exercise. Refill of birth control pills for one year.  Routine labs, Hep C  aby. TDap. Follow up annually and prn.   After visit summary provided.

## 2019-12-09 NOTE — Patient Instructions (Signed)

## 2019-12-10 LAB — COMPREHENSIVE METABOLIC PANEL
ALT: 20 IU/L (ref 0–32)
AST: 18 IU/L (ref 0–40)
Albumin/Globulin Ratio: 1.4 (ref 1.2–2.2)
Albumin: 4.3 g/dL (ref 3.9–5.0)
Alkaline Phosphatase: 57 IU/L (ref 48–121)
BUN/Creatinine Ratio: 7 — ABNORMAL LOW (ref 9–23)
BUN: 6 mg/dL (ref 6–20)
Bilirubin Total: 0.3 mg/dL (ref 0.0–1.2)
CO2: 23 mmol/L (ref 20–29)
Calcium: 9.1 mg/dL (ref 8.7–10.2)
Chloride: 106 mmol/L (ref 96–106)
Creatinine, Ser: 0.89 mg/dL (ref 0.57–1.00)
GFR calc Af Amer: 101 mL/min/{1.73_m2} (ref >59)
GFR calc non Af Amer: 87 mL/min/{1.73_m2} (ref >59)
Globulin, Total: 3.1 g/dL (ref 1.5–4.5)
Glucose: 127 mg/dL — ABNORMAL HIGH (ref 65–99)
Potassium: 4.3 mmol/L (ref 3.5–5.2)
Sodium: 141 mmol/L (ref 134–144)
Total Protein: 7.4 g/dL (ref 6.0–8.5)

## 2019-12-10 LAB — LIPID PANEL
Chol/HDL Ratio: 3.5 ratio (ref 0.0–4.4)
Cholesterol, Total: 184 mg/dL (ref 100–199)
HDL: 52 mg/dL (ref >39)
LDL Chol Calc (NIH): 113 mg/dL — ABNORMAL HIGH (ref 0–99)
Triglycerides: 107 mg/dL (ref 0–149)
VLDL Cholesterol Cal: 19 mg/dL (ref 5–40)

## 2019-12-10 LAB — CBC
Hematocrit: 39.1 % (ref 34.0–46.6)
Hemoglobin: 14 g/dL (ref 11.1–15.9)
MCH: 30.8 pg (ref 26.6–33.0)
MCHC: 35.8 g/dL — ABNORMAL HIGH (ref 31.5–35.7)
MCV: 86 fL (ref 79–97)
Platelets: 400 10*3/uL (ref 150–450)
RBC: 4.54 x10E6/uL (ref 3.77–5.28)
RDW: 12.7 % (ref 11.7–15.4)
WBC: 9.7 10*3/uL (ref 3.4–10.8)

## 2019-12-10 LAB — HEPATITIS C ANTIBODY: Hep C Virus Ab: <0.1 s/co ratio (ref 0.0–0.9)

## 2019-12-10 LAB — CYTOLOGY - PAP
Comment: NEGATIVE
Diagnosis: NEGATIVE
High risk HPV: NEGATIVE

## 2019-12-16 LAB — HGB A1C W/O EAG: Hgb A1c MFr Bld: 5.3 % (ref 4.8–5.6)

## 2019-12-16 LAB — SPECIMEN STATUS REPORT

## 2020-02-09 ENCOUNTER — Other Ambulatory Visit: Payer: Self-pay | Admitting: Critical Care Medicine

## 2020-02-09 ENCOUNTER — Other Ambulatory Visit: Payer: BC Managed Care – PPO

## 2020-02-09 DIAGNOSIS — Z20822 Contact with and (suspected) exposure to covid-19: Secondary | ICD-10-CM

## 2020-02-11 LAB — NOVEL CORONAVIRUS, NAA: SARS-CoV-2, NAA: NOT DETECTED

## 2020-10-08 DIAGNOSIS — U071 COVID-19: Secondary | ICD-10-CM

## 2020-10-08 HISTORY — DX: COVID-19: U07.1

## 2020-12-14 ENCOUNTER — Encounter: Payer: Self-pay | Admitting: Obstetrics and Gynecology

## 2020-12-14 ENCOUNTER — Other Ambulatory Visit (HOSPITAL_COMMUNITY)
Admission: RE | Admit: 2020-12-14 | Discharge: 2020-12-14 | Disposition: A | Discharge status: Home / Self Care | Payer: BC Managed Care – PPO | Source: Ambulatory Visit | Attending: Obstetrics and Gynecology | Admitting: Obstetrics and Gynecology

## 2020-12-14 ENCOUNTER — Ambulatory Visit (INDEPENDENT_AMBULATORY_CARE_PROVIDER_SITE_OTHER): Payer: BC Managed Care – PPO | Admitting: Obstetrics and Gynecology

## 2020-12-14 ENCOUNTER — Other Ambulatory Visit: Payer: Self-pay

## 2020-12-14 VITALS — BP 120/60 | HR 68 | Ht 65.0 in | Wt 156.0 lb

## 2020-12-14 DIAGNOSIS — Z23 Encounter for immunization: Secondary | ICD-10-CM | POA: Diagnosis not present

## 2020-12-14 DIAGNOSIS — Z113 Encounter for screening for infections with a predominantly sexual mode of transmission: Secondary | ICD-10-CM

## 2020-12-14 DIAGNOSIS — Z3041 Encounter for surveillance of contraceptive pills: Secondary | ICD-10-CM | POA: Diagnosis not present

## 2020-12-14 DIAGNOSIS — Z01419 Encounter for gynecological examination (general) (routine) without abnormal findings: Secondary | ICD-10-CM

## 2020-12-14 MED ORDER — LEVONORGEST-ETH ESTRAD 91-DAY 0.15-0.03 &0.01 MG PO TABS: 1.0000 | ORAL_TABLET | Freq: Every day | ORAL | 3 refills | Status: DC

## 2020-12-14 NOTE — Progress Notes (Signed)
30 y.o. G18P0000 Married Caucasian female here for annual exam.    Lost 20 pounds and eating better.   Getting divorced.  Has a new partner.   Agrees to STD screening.   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   No Covid vaccine.  Had Covid 2 months ago.   PCP:   None  Patient's last menstrual period was 10/02/2020 (approximate).     Period Cycle (Days): 84 Period Duration (Days): 3-4 Period Pattern: Regular Menstrual Flow: Light Menstrual Control: Tampon Dysmenorrhea: (!) Moderate (cramping first day or two) Dysmenorrhea Symptoms: Diarrhea, Headache     Sexually active: Yes.    The current method of family planning is OCP (estrogen/progesterone)--Amethia.    Exercising: Yes.     All types Smoker:  no  Health Maintenance: Pap:  12-09-19 Neg:Neg HR HPV, 09-18-17 neg, 09-12-14 neg History of abnormal Pap:  no MMG:  n/a Colonoscopy:  n/a BMD:   n/a  Result  n/a TDaP:  12-09-19 Gardasil:   no--declines HIV:2011 NR Hep C:12-09-19 Neg Screening Labs: Today.    reports that she has never smoked. She has never used smokeless tobacco. She reports current alcohol use. She reports that she does not use drugs.  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     Triamcinolone Acetonide (NASACORT ALLERGY 24HR NA) Place into the nose.     Levonorgestrel-Ethinyl Estradiol (AMETHIA) 0.15-0.03 &0.01 MG tablet Take 1 tablet by mouth daily. 91 tablet 3   No current facility-administered medications for this visit.     Family History  Problem Relation Age of Onset   Hypertension Mother    Heart disease Father    Atrial fibrillation Father    Heart attack Father    Stroke Paternal Grandfather    Hypertension Maternal Grandmother    Hypertension Maternal Grandfather    Heart disease Maternal Grandfather     Review of Systems  All other systems reviewed and are negative.  Exam:   BP 120/60   Pulse 68   Ht 5\' 5"  (1.651 m)   Wt 156 lb (70.8 kg)   LMP 10/02/2020 (Approximate)   SpO2 100%   BMI 25.96 kg/m     General appearance: alert, cooperative and appears stated age Head: normocephalic, without obvious abnormality, atraumatic Neck: no adenopathy, supple, symmetrical, trachea midline and thyroid normal to inspection and palpation Lungs: clear to auscultation bilaterally Breasts: normal appearance, no masses or tenderness, No nipple retraction or dimpling, No nipple discharge or bleeding, No axillary adenopathy Heart: regular rate and rhythm Abdomen: soft, non-tender; no masses, no organomegaly Extremities: extremities normal, atraumatic, no cyanosis or edema Skin: skin color, texture, turgor normal. No rashes or lesions Lymph nodes: cervical, supraclavicular, and axillary nodes normal. Neurologic: grossly normal  Pelvic: External genitalia:  no lesions              No abnormal inguinal nodes palpated.  Urethra:  normal appearing urethra with no masses, tenderness or lesions              Bartholins and Skenes: normal                 Vagina: normal appearing vagina with normal color and discharge, no lesions              Cervix: no lesions              Pap taken: no. Bimanual Exam:  Uterus:  normal size, contour, position, consistency, mobility, non-tender              Adnexa: no mass, fullness, tenderness           Chaperone was present for exam.  Assessment:   Well woman visit with normal exam. Hx ITP. Status post splenectomy.  Hx stress  fracture.  Plan: Mammogram screening discussed. Self breast awareness reviewed. Pap and HR HPV as above. Guidelines for Calcium, Vitamin D, regular exercise program including cardiovascular and weight bearing exercise. STD screening and routine labs.  Start Gardasil vaccine series.  Refill of birth control for one year.  Follow up annually and prn.

## 2020-12-14 NOTE — Patient Instructions (Signed)

## 2020-12-15 LAB — COMPREHENSIVE METABOLIC PANEL
AG Ratio: 1.2 (calc) (ref 1.0–2.5)
ALT: 23 U/L (ref 6–29)
AST: 18 U/L (ref 10–30)
Albumin: 4.2 g/dL (ref 3.6–5.1)
Alkaline phosphatase (APISO): 51 U/L (ref 31–125)
BUN: 10 mg/dL (ref 7–25)
CO2: 21 mmol/L (ref 20–32)
Calcium: 9.3 mg/dL (ref 8.6–10.2)
Chloride: 107 mmol/L (ref 98–110)
Creat: 0.91 mg/dL (ref 0.50–1.10)
Globulin: 3.6 g/dL (calc) (ref 1.9–3.7)
Glucose, Bld: 80 mg/dL (ref 65–99)
Potassium: 4.1 mmol/L (ref 3.5–5.3)
Sodium: 137 mmol/L (ref 135–146)
Total Bilirubin: 0.3 mg/dL (ref 0.2–1.2)
Total Protein: 7.8 g/dL (ref 6.1–8.1)

## 2020-12-15 LAB — CERVICOVAGINAL ANCILLARY ONLY
Chlamydia: NEGATIVE
Comment: NEGATIVE
Comment: NEGATIVE
Comment: NORMAL
Neisseria Gonorrhea: NEGATIVE
Trichomonas: NEGATIVE

## 2020-12-15 LAB — CBC
HCT: 39.4 % (ref 35.0–45.0)
Hemoglobin: 13.3 g/dL (ref 11.7–15.5)
MCH: 30.4 pg (ref 27.0–33.0)
MCHC: 33.8 g/dL (ref 32.0–36.0)
MCV: 90 fL (ref 80.0–100.0)
MPV: 11.1 fL (ref 7.5–12.5)
Platelets: 360 10*3/uL (ref 140–400)
RBC: 4.38 10*6/uL (ref 3.80–5.10)
RDW: 13.1 % (ref 11.0–15.0)
WBC: 8.1 10*3/uL (ref 3.8–10.8)

## 2020-12-15 LAB — LIPID PANEL
Cholesterol: 173 mg/dL (ref <200)
HDL: 53 mg/dL (ref >=50)
LDL Cholesterol (Calc): 99 mg/dL (calc)
Non-HDL Cholesterol (Calc): 120 mg/dL (calc) (ref <130)
Total CHOL/HDL Ratio: 3.3 (calc) (ref <5.0)
Triglycerides: 111 mg/dL (ref <150)

## 2020-12-15 LAB — HIV ANTIBODY (ROUTINE TESTING W REFLEX): HIV 1&2 Ab, 4th Generation: NONREACTIVE

## 2020-12-15 LAB — RPR: RPR Ser Ql: NONREACTIVE

## 2020-12-15 LAB — HEPATITIS C ANTIBODY
Hepatitis C Ab: NONREACTIVE
SIGNAL TO CUT-OFF: 0.01 (ref <1.00)

## 2020-12-15 LAB — HEPATITIS B SURFACE ANTIGEN: Hepatitis B Surface Ag: NONREACTIVE

## 2021-02-19 ENCOUNTER — Ambulatory Visit (INDEPENDENT_AMBULATORY_CARE_PROVIDER_SITE_OTHER): Payer: BC Managed Care – PPO

## 2021-02-19 ENCOUNTER — Other Ambulatory Visit: Payer: Self-pay

## 2021-02-19 DIAGNOSIS — Z23 Encounter for immunization: Secondary | ICD-10-CM | POA: Diagnosis not present

## 2021-02-19 NOTE — Progress Notes (Signed)
Patient in today for 2nd Gardasil injection.   Contraception: OCP LMP: 01/29/21 Last AEX: 12/14/20 with BS  Injection given in LD. Patient tolerated shot well.   Patient informed next injection due in about 4 months.  Advised patient, if not on birth control, to return for next injection with cycle.   Routed to provider for final review.  Encounter closed.

## 2021-06-04 ENCOUNTER — Telehealth: Payer: BC Managed Care – PPO | Admitting: Nurse Practitioner

## 2021-06-04 DIAGNOSIS — N3000 Acute cystitis without hematuria: Secondary | ICD-10-CM

## 2021-06-04 MED ORDER — NITROFURANTOIN MONOHYD MACRO 100 MG PO CAPS: 100.0000 mg | ORAL_CAPSULE | Freq: Two times a day (BID) | ORAL | 0 refills | Status: AC

## 2021-06-04 NOTE — Progress Notes (Signed)
E-Visit for Urinary Problems ° °We are sorry that you are not feeling well.  Here is how we plan to help! ° °Based on what you shared with me it looks like you most likely have a simple urinary tract infection. ° °A UTI (Urinary Tract Infection) is a bacterial infection of the bladder. ° °Most cases of urinary tract infections are simple to treat but a key part of your care is to encourage you to drink plenty of fluids and watch your symptoms carefully. ° °I have prescribed MacroBid 100 mg twice a day for 5 days.  Your symptoms should gradually improve. Call us if the burning in your urine worsens, you develop worsening fever, back pain or pelvic pain or if your symptoms do not resolve after completing the antibiotic. ° °Urinary tract infections can be prevented by drinking plenty of water to keep your body hydrated.  Also be sure when you wipe, wipe from front to back and don't hold it in!  If possible, empty your bladder every 4 hours. ° °HOME CARE °Drink plenty of fluids °Compete the full course of the antibiotics even if the symptoms resolve °Remember, when you need to go…go. Holding in your urine can increase the likelihood of getting a UTI! °GET HELP RIGHT AWAY IF: °You cannot urinate °You get a high fever °Worsening back pain occurs °You see blood in your urine °You feel sick to your stomach or throw up °You feel like you are going to pass out ° °MAKE SURE YOU  °Understand these instructions. °Will watch your condition. °Will get help right away if you are not doing well or get worse. ° ° °Thank you for choosing an e-visit. ° °Your e-visit answers were reviewed by a board certified advanced clinical practitioner to complete your personal care plan. Depending upon the condition, your plan could have included both over the counter or prescription medications. ° °Please review your pharmacy choice. Make sure the pharmacy is open so you can pick up prescription now. If there is a problem, you may contact your  provider through MyChart messaging and have the prescription routed to another pharmacy.  Your safety is important to us. If you have drug allergies check your prescription carefully.  ° °For the next 24 hours you can use MyChart to ask questions about today's visit, request a non-urgent call back, or ask for a work or school excuse. °You will get an email in the next two days asking about your experience. I hope that your e-visit has been valuable and will speed your recovery.  ° °I spent approximately 7 minutes reviewing the patient's history, current symptoms and coordinating their plan of care today.   ° °Meds ordered this encounter  °Medications  ° nitrofurantoin, macrocrystal-monohydrate, (MACROBID) 100 MG capsule  °  Sig: Take 1 capsule (100 mg total) by mouth 2 (two) times daily for 5 days.  °  Dispense:  10 capsule  °  Refill:  0  °  °

## 2021-06-26 ENCOUNTER — Ambulatory Visit (INDEPENDENT_AMBULATORY_CARE_PROVIDER_SITE_OTHER): Payer: BC Managed Care – PPO

## 2021-06-26 ENCOUNTER — Other Ambulatory Visit: Payer: Self-pay

## 2021-06-26 DIAGNOSIS — Z23 Encounter for immunization: Secondary | ICD-10-CM | POA: Diagnosis not present

## 2021-09-11 ENCOUNTER — Ambulatory Visit
Admission: RE | Admit: 2021-09-11 | Discharge: 2021-09-11 | Disposition: A | Discharge status: Home / Self Care | Payer: BC Managed Care – PPO | Source: Ambulatory Visit | Attending: Emergency Medicine | Admitting: Emergency Medicine

## 2021-09-11 VITALS — HR 87 | Temp 98.2°F | Resp 18

## 2021-09-11 DIAGNOSIS — J020 Streptococcal pharyngitis: Secondary | ICD-10-CM | POA: Diagnosis not present

## 2021-09-11 LAB — POCT RAPID STREP A (OFFICE): Rapid Strep A Screen: POSITIVE — AB

## 2021-09-11 MED ORDER — AMOXICILLIN 500 MG PO CAPS: 500.0000 mg | ORAL_CAPSULE | Freq: Two times a day (BID) | ORAL | 0 refills | Status: AC

## 2021-09-11 NOTE — Discharge Instructions (Addendum)
You have strep throat.  Take the amoxicillin as directed.  Follow up with your primary care provider if your symptoms are not improving.   ? ?

## 2021-09-11 NOTE — ED Triage Notes (Signed)
Pt here with sore throat, chills, generalized body aches, headache since yesterday.  ?

## 2021-09-11 NOTE — ED Provider Notes (Signed)
?[facility] ? ? ? ?CSN: [id number] ?Arrival date & time: 09/11/21  1257 ? ? ?  ? ?History   ?Chief Complaint ?Chief Complaint  ?Patient presents with  ? Sore Throat  ? Generalized Body Aches  ? Headache  ? ? ?HPI ?Ana Becker is a 31 y.o. female.  Patient presents with 1 day history of low-grade fever, chills, body aches, headache, sore throat.  Treatment at home with ibuprofen; last taken during the night.  She denies rash, cough, shortness of breath, vomiting, diarrhea, or other symptoms.  Her medical history includes idiopathic thrombocytopenia, anxiety, depression. ? ?The history is provided by the patient and medical records.  ? ?Past Medical History:  ?Diagnosis Date  ? Anxiety   ? COVID 10/2020  ? Depression   ? Heel fracture   ? History of ITP   ? ITP (idiopathic thrombocytopenic purpura) 2011  ? Spleenectomy   ? ? ?Patient Active Problem List  ? Diagnosis Date Noted  ? Stress fracture of calcaneus, left, with routine healing, subsequent encounter 09/03/2017  ? ? ?Past Surgical History:  ?Procedure Laterality Date  ? CHOLECYSTECTOMY  2010  ? GALLBLADDER SURGERY  01/2009  ? SPLENECTOMY  05/2010  ? SPLENECTOMY, TOTAL  2011  ? Due to ITP  ? ? ?OB History   ? ? Gravida  ?0  ? Para  ?0  ? Term  ?0  ? Preterm  ?0  ? AB  ?0  ? Living  ?0  ?  ? ? SAB  ?0  ? IAB  ?0  ? Ectopic  ?0  ? Multiple  ?0  ? Live Births  ?   ?   ?  ?  ? ? ? ?Home Medications   ? ?Prior to Admission medications   ?Medication Sig Start Date End Date Taking? Authorizing Provider  ?amoxicillin (AMOXIL) 500 MG capsule Take 1 capsule (500 mg total) by mouth 2 (two) times daily for 10 days. 09/11/21 09/21/21 Yes Mickie Bail, NP  ?Levonorgestrel-Ethinyl Estradiol (AMETHIA) 0.15-0.03 &0.01 MG tablet Take 1 tablet by mouth daily. 12/14/20   Patton Salles, MD  ?Melatonin 3 MG CAPS Take 1 capsule by mouth as needed.    [provider]  ?Triamcinolone Acetonide (NASACORT ALLERGY 24HR NA) Place into the nose.    [provider]  ? ? ?Family History ?Family History  ?Problem Relation Age of Onset  ? Hypertension Mother   ? Heart disease Father   ? Atrial fibrillation Father   ? Heart attack Father   ? Stroke Paternal Grandfather   ? Hypertension Maternal Grandmother   ? Hypertension Maternal Grandfather   ? Heart disease Maternal Grandfather   ? ? ?Social History ?Social History  ? ?Tobacco Use  ? Smoking status: Never  ? Smokeless tobacco: Never  ?Vaping Use  ? Vaping Use: Never used  ?Substance Use Topics  ? Alcohol use: Yes  ?  Comment: 2 drinks/month  ? Drug use: No  ? ? ? ?Allergies   ?Patient has no known allergies. ? ? ?Review of Systems ?Review of Systems  ?Constitutional:  Positive for chills and fever.  ?HENT:  Positive for sore throat. Negative for ear pain.   ?Respiratory:  Negative for cough and shortness of breath.   ?Gastrointestinal:  Negative for diarrhea and vomiting.  ?Skin:  Negative for color change and rash.  ?Neurological:  Positive for headaches.  ?All other systems reviewed and are negative. ? ? ?Physical Exam ?  Triage Vital Signs ?ED Triage Vitals  ?Enc Vitals Group  ?   BP   ?   Pulse   ?   Resp   ?   Temp   ?   Temp src   ?   SpO2   ?   Weight   ?   Height   ?   Head Circumference   ?   Peak Flow   ?   Pain Score   ?   Pain Loc   ?   Pain Edu?   ?   Excl. in GC?   ? ?No data found. ? ?Updated Vital Signs ?Pulse 87   Temp 98.2 ?F (36.8 ?C)   Resp 18   SpO2 97%  ? ?Visual Acuity ?Right Eye Distance:   ?Left Eye Distance:   ?Bilateral Distance:   ? ?Right Eye Near:   ?Left Eye Near:    ?Bilateral Near:    ? ?Physical Exam ?Vitals and nursing note reviewed.  ?Constitutional:   ?   General: She is not in acute distress. ?   Appearance: She is well-developed.  ?HENT:  ?   Right Ear: Tympanic membrane normal.  ?   Left Ear: Tympanic membrane normal.  ?   Nose: Nose normal.  ?   Mouth/Throat:  ?   Mouth: Mucous membranes are moist.  ?   Pharynx: Oropharyngeal exudate and posterior oropharyngeal erythema  present.  ?Cardiovascular:  ?   Rate and Rhythm: Normal rate and regular rhythm.  ?   Heart sounds: Normal heart sounds.  ?Pulmonary:  ?   Effort: Pulmonary effort is normal. No respiratory distress.  ?   Breath sounds: Normal breath sounds.  ?Musculoskeletal:  ?   Cervical back: Neck supple.  ?Skin: ?   General: Skin is warm and dry.  ?Neurological:  ?   Mental Status: She is alert.  ?Psychiatric:     ?   Mood and Affect: Mood normal.     ?   Behavior: Behavior normal.  ? ? ? ?UC Treatments / Results  ?Labs ?(all labs ordered are listed, but only abnormal results are displayed) ?Labs Reviewed  ?POCT RAPID STREP A (OFFICE) - Abnormal; Notable for the following components:  ?    Result Value  ? Rapid Strep A Screen Positive (*)   ? All other components within normal limits  ? ? ?EKG ? ? ?Radiology ?No results found. ? ?Procedures ?Procedures (including critical care time) ? ?Medications Ordered in UC ?Medications - No data to display ? ?Initial Impression / Assessment and Plan / UC Course  ?I have reviewed the triage vital signs and the nursing notes. ? ?Pertinent labs & imaging results that were available during my care of the patient were reviewed by me and considered in my medical decision making (see chart for details). ? ?  ?Strep pharyngitis.  Rapid strep positive.  Treating with amoxicillin.  Discussed Tylenol or ibuprofen as needed.  Education provided on strep throat.  Instructed patient to follow up with her PCP if her symptoms are not improving.  She agrees to plan of care.  ? ? ?Final Clinical Impressions(s) / UC Diagnoses  ? ?Final diagnoses:  ?Strep pharyngitis  ? ? ? ?Discharge Instructions   ? ?  ?You have strep throat.  Take the amoxicillin as directed.  Follow up with your primary care provider if your symptoms are not improving.   ? ? ? ? ? ?ED Prescriptions   ? ?  Medication Sig Dispense Auth. Provider  ? amoxicillin (AMOXIL) 500 MG capsule Take 1 capsule (500 mg total) by mouth 2 (two) times daily  for 10 days. 20 capsule Mickie Bail, NP  ? ?  ? ?PDMP not reviewed this encounter. ?  ?Mickie Bail, NP ?09/11/21 1327 ? ?

## 2021-12-17 NOTE — Progress Notes (Unsigned)
31 y.o. G67P0000 Married Caucasian female here for annual exam.    PCP:   None  No LMP recorded. (Menstrual status: Oral contraceptives).           Sexually active: {yes no:314532}  The current method of family planning is ***OCP (estrogen/progesterone).    Exercising: {yes no:314532}  {types:19826} Smoker:  no  Health Maintenance: Pap:   12-09-19 Neg:Neg HR HPV, 09-18-17 neg, 09-12-14 neg History of abnormal Pap:  no MMG:  n/a Colonoscopy:  n/a BMD:   n/a  Result  n/a TDaP:  12-09-19 Gardasil:   yes, completed HIV: 12-14-20 NR Hep C: 12-14-20 Neg Screening Labs:  Hb today: ***, Urine today: ***   reports that she has never smoked. She has never used smokeless tobacco. She reports current alcohol use. She reports that she does not use drugs.  Past Medical History:  Diagnosis Date   Anxiety    COVID 10/2020   Depression    Heel fracture    History of ITP    ITP (idiopathic thrombocytopenic purpura) 2011   Spleenectomy     Past Surgical History:  Procedure Laterality Date   CHOLECYSTECTOMY  2010   GALLBLADDER SURGERY  01/2009   SPLENECTOMY  05/2010   SPLENECTOMY, TOTAL  2011   Due to ITP    Current Outpatient Medications  Medication Sig Dispense Refill   Levonorgestrel-Ethinyl Estradiol (AMETHIA) 0.15-0.03 &0.01 MG tablet Take 1 tablet by mouth daily. 91 tablet 3   Melatonin 3 MG CAPS Take 1 capsule by mouth as needed.     Triamcinolone Acetonide (NASACORT ALLERGY 24HR NA) Place into the nose.     No current facility-administered medications for this visit.    Family History  Problem Relation Age of Onset   Hypertension Mother    Heart disease Father    Atrial fibrillation Father    Heart attack Father    Stroke Paternal Grandfather    Hypertension Maternal Grandmother    Hypertension Maternal Grandfather    Heart disease Maternal Grandfather     Review of Systems  Exam:   There were no vitals taken for this visit.    General appearance: alert, cooperative and  appears stated age Head: normocephalic, without obvious abnormality, atraumatic Neck: no adenopathy, supple, symmetrical, trachea midline and thyroid normal to inspection and palpation Lungs: clear to auscultation bilaterally Breasts: normal appearance, no masses or tenderness, No nipple retraction or dimpling, No nipple discharge or bleeding, No axillary adenopathy Heart: regular rate and rhythm Abdomen: soft, non-tender; no masses, no organomegaly Extremities: extremities normal, atraumatic, no cyanosis or edema Skin: skin color, texture, turgor normal. No rashes or lesions Lymph nodes: cervical, supraclavicular, and axillary nodes normal. Neurologic: grossly normal  Pelvic: External genitalia:  no lesions              No abnormal inguinal nodes palpated.              Urethra:  normal appearing urethra with no masses, tenderness or lesions              Bartholins and Skenes: normal                 Vagina: normal appearing vagina with normal color and discharge, no lesions              Cervix: no lesions              Pap taken: {yes no:314532} Bimanual Exam:  Uterus:  normal size, contour, position,  consistency, mobility, non-tender              Adnexa: no mass, fullness, tenderness              Rectal exam: {yes no:314532}.  Confirms.              Anus:  normal sphincter tone, no lesions  Chaperone was present for exam:  ***  Assessment:   Well woman visit with gynecologic exam.   Plan: Mammogram screening discussed. Self breast awareness reviewed. Pap and HR HPV as above. Guidelines for Calcium, Vitamin D, regular exercise program including cardiovascular and weight bearing exercise.   Follow up annually and prn.   Additional counseling given.  {yes [id number]. _______ minutes face to face time of which over 50% was spent in counseling.    After visit summary provided.

## 2021-12-18 ENCOUNTER — Ambulatory Visit (INDEPENDENT_AMBULATORY_CARE_PROVIDER_SITE_OTHER): Payer: BC Managed Care – PPO | Admitting: Obstetrics and Gynecology

## 2021-12-18 ENCOUNTER — Encounter: Payer: Self-pay | Admitting: Obstetrics and Gynecology

## 2021-12-18 VITALS — BP 140/80 | HR 60 | Ht 65.0 in | Wt 154.0 lb

## 2021-12-18 DIAGNOSIS — Z862 Personal history of diseases of the blood and blood-forming organs and certain disorders involving the immune mechanism: Secondary | ICD-10-CM

## 2021-12-18 DIAGNOSIS — Z3041 Encounter for surveillance of contraceptive pills: Secondary | ICD-10-CM

## 2021-12-18 DIAGNOSIS — Z01419 Encounter for gynecological examination (general) (routine) without abnormal findings: Secondary | ICD-10-CM | POA: Diagnosis not present

## 2021-12-18 LAB — CBC
HCT: 40.4 % (ref 35.0–45.0)
Hemoglobin: 14 g/dL (ref 11.7–15.5)
MCH: 30.6 pg (ref 27.0–33.0)
MCHC: 34.7 g/dL (ref 32.0–36.0)
MCV: 88.2 fL (ref 80.0–100.0)
MPV: 10.9 fL (ref 7.5–12.5)
Platelets: 374 10*3/uL (ref 140–400)
RBC: 4.58 10*6/uL (ref 3.80–5.10)
RDW: 12.2 % (ref 11.0–15.0)
WBC: 9.4 10*3/uL (ref 3.8–10.8)

## 2021-12-18 MED ORDER — LEVONORGEST-ETH ESTRAD 91-DAY 0.15-0.03 &0.01 MG PO TABS: 1.0000 | ORAL_TABLET | Freq: Every day | ORAL | 3 refills | Status: DC

## 2021-12-18 NOTE — Patient Instructions (Signed)

## 2022-11-18 DIAGNOSIS — M533 Sacrococcygeal disorders, not elsewhere classified: Secondary | ICD-10-CM | POA: Diagnosis not present

## 2022-12-16 NOTE — Progress Notes (Deleted)
32 y.o. G0P0000 Divorced Caucasian female here for annual exam.    PCP:     No LMP recorded. (Menstrual status: Oral contraceptives).           Sexually active: {yes no:314532}  The current method of family planning is OCP (estrogen/progesterone).    Exercising: {yes no:314532}  {types:19826} Smoker:  no  Health Maintenance: Pap:  12/09/19 neg: HR HPV neg, 09/18/17 neg History of abnormal Pap:  no MMG:  n/a Colonoscopy:  n/a BMD:   n/a  Result  n/a TDaP:  7/1/1 Gardasil:   yes HIV: 12/14/20 NR Hep C: 12/14/20 NR Screening Labs:  Hb today: ***, Urine today: ***   reports that she has never smoked. She has never used smokeless tobacco. She reports current alcohol use. She reports that she does not use drugs.  Past Medical History:  Diagnosis Date   Anxiety    COVID 10/2020   Depression    Heel fracture    History of ITP    ITP (idiopathic thrombocytopenic purpura) 2011   Spleenectomy     Past Surgical History:  Procedure Laterality Date   CHOLECYSTECTOMY  2010   GALLBLADDER SURGERY  01/2009   SPLENECTOMY  05/2010   SPLENECTOMY, TOTAL  2011   Due to ITP    Current Outpatient Medications  Medication Sig Dispense Refill   Levonorgestrel-Ethinyl Estradiol (AMETHIA) 0.15-0.03 &0.01 MG tablet Take 1 tablet by mouth daily. 91 tablet 3   Melatonin 3 MG CAPS Take 1 capsule by mouth as needed.     Triamcinolone Acetonide (NASACORT ALLERGY 24HR NA) Place into the nose.     No current facility-administered medications for this visit.    Family History  Problem Relation Age of Onset   Hypertension Mother    Heart disease Father    Atrial fibrillation Father    Heart attack Father    Stroke Paternal Grandfather    Hypertension Maternal Grandmother    Hypertension Maternal Grandfather    Heart disease Maternal Grandfather     Review of Systems  Exam:   There were no vitals taken for this visit.    General appearance: alert, cooperative and appears stated age Head:  normocephalic, without obvious abnormality, atraumatic Neck: no adenopathy, supple, symmetrical, trachea midline and thyroid normal to inspection and palpation Lungs: clear to auscultation bilaterally Breasts: normal appearance, no masses or tenderness, No nipple retraction or dimpling, No nipple discharge or bleeding, No axillary adenopathy Heart: regular rate and rhythm Abdomen: soft, non-tender; no masses, no organomegaly Extremities: extremities normal, atraumatic, no cyanosis or edema Skin: skin color, texture, turgor normal. No rashes or lesions Lymph nodes: cervical, supraclavicular, and axillary nodes normal. Neurologic: grossly normal  Pelvic: External genitalia:  no lesions              No abnormal inguinal nodes palpated.              Urethra:  normal appearing urethra with no masses, tenderness or lesions              Bartholins and Skenes: normal                 Vagina: normal appearing vagina with normal color and discharge, no lesions              Cervix: no lesions              Pap taken: {yes no:314532} Bimanual Exam:  Uterus:  normal size, contour, position, consistency, mobility, non-tender  Adnexa: no mass, fullness, tenderness              Rectal exam: {yes no:314532}.  Confirms.              Anus:  normal sphincter tone, no lesions  Chaperone was present for exam:  ***  Assessment:   Well woman visit with gynecologic exam.   Plan: Mammogram screening discussed. Self breast awareness reviewed. Pap and HR HPV as above. Guidelines for Calcium, Vitamin D, regular exercise program including cardiovascular and weight bearing exercise.   Follow up annually and prn.   Additional counseling given.  {yes [id number]. _______ minutes face to face time of which over 50% was spent in counseling.    After visit summary provided.

## 2022-12-23 ENCOUNTER — Ambulatory Visit: Payer: BC Managed Care – PPO | Admitting: Obstetrics and Gynecology

## 2022-12-26 NOTE — Progress Notes (Signed)
32 y.o. G0P0000 Divorced Caucasian female here for annual exam.    Occasional breakthrough bleeding.  Periods are light with slight cramping.  Wants to continue with her pills.   Desired STD screening.   She checks her BP at home.  It has been 110-120/85.  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   PCP:   none  Patient's last menstrual period was 09/30/2022.           Sexually active: Yes.    The current method of family planning is OCP (estrogen/progesterone).    Exercising: Yes.     Cross fit and yoga Smoker:  no  Health Maintenance: Pap:  12/09/19 neg: HR HPV neg, 09/18/17 neg History of abnormal Pap:  no MMG:  n/a Colonoscopy:  n/a BMD:   n/a  Result  n/a TDaP:  12/09/19 Gardasil:   yes HIV: 12/14/20 NR Hep C: 12/14/20 NR Screening Labs:  she will return for this.     reports that she has never smoked. She has never used smokeless tobacco. She reports current alcohol use. She reports that she does not use drugs.  Past Medical History:  Diagnosis Date   Anxiety    COVID 10/2020   Depression    Heel fracture    History of ITP    ITP (idiopathic thrombocytopenic purpura) 2011   Spleenectomy     Past Surgical History:  Procedure Laterality Date   CHOLECYSTECTOMY  2010   GALLBLADDER SURGERY  01/2009   SPLENECTOMY  05/2010   SPLENECTOMY, TOTAL  2011   Due to ITP    Current Outpatient Medications  Medication Sig Dispense Refill   Levonorgestrel-Ethinyl Estradiol (AMETHIA) 0.15-0.03 &0.01 MG tablet Take 1 tablet by mouth daily. 91 tablet 3   Melatonin 3 MG CAPS Take 1 capsule by mouth as needed.     No current facility-administered medications for this visit.    Family History  Problem Relation Age of Onset   Hypertension Mother    Heart disease Father    Atrial fibrillation Father    Heart attack Father    Stroke Paternal Grandfather    Hypertension Maternal Grandmother    Hypertension Maternal Grandfather    Heart disease Maternal  Grandfather     Review of Systems  All other systems reviewed and are negative.   Exam:   BP 126/82 (BP Location: Left Arm, Patient Position: Sitting, Cuff Size: Normal)   Pulse (!) 51   Ht 5\' 7"  (1.702 m)   Wt 147 lb (66.7 kg)   LMP 09/30/2022   SpO2 98%   BMI 23.02 kg/m     General appearance: alert, cooperative and appears stated age Head: normocephalic, without obvious abnormality, atraumatic Neck: no adenopathy, supple, symmetrical, trachea midline and thyroid normal to inspection and palpation Lungs: clear to auscultation bilaterally Breasts: normal appearance, no masses or tenderness, No nipple retraction or dimpling, No nipple discharge or bleeding, No axillary adenopathy Heart: regular rate and rhythm Abdomen: soft, non-tender; no masses, no organomegaly Extremities: extremities normal, atraumatic, no cyanosis or edema Skin: skin color, texture, turgor normal. No rashes or lesions Lymph nodes: cervical, supraclavicular, and axillary nodes normal. Neurologic: grossly normal  Pelvic: External genitalia:  no lesions              No abnormal inguinal nodes palpated.              Urethra:  normal appearing urethra with no masses, tenderness or lesions  Bartholins and Skenes: normal                 Vagina: normal appearing vagina with normal color and discharge, no lesions              Cervix: no lesions              Pap taken: no Bimanual Exam:  Uterus:  normal size, contour, position, consistency, mobility, non-tender              Adnexa: no mass, fullness, tenderness            Chaperone was present for exam:  Warren Lacy, CMA  Assessment:   Well woman visit with gynecologic exam. Hx ITP. Birth control pill surveillance. Status post splenectomy.  Hx stress fracture.  Plan: Mammogram screening discussed. Self breast awareness reviewed. Pap and HR HPV  2026. Guidelines for Calcium, Vitamin D, regular exercise program including cardiovascular and weight  bearing exercise. Refill of birth control pills for one year.  GC/CT/trich testing.  She will return for HIV, syphilis, hep C aby and CBC.    Follow up annually and prn.   After visit summary provided.

## 2022-12-27 ENCOUNTER — Other Ambulatory Visit (HOSPITAL_COMMUNITY)
Admission: RE | Admit: 2022-12-27 | Discharge: 2022-12-27 | Disposition: A | Discharge status: Home / Self Care | Payer: BC Managed Care – PPO | Source: Ambulatory Visit | Attending: Obstetrics and Gynecology | Admitting: Obstetrics and Gynecology

## 2022-12-27 ENCOUNTER — Encounter: Payer: Self-pay | Admitting: Obstetrics and Gynecology

## 2022-12-27 ENCOUNTER — Ambulatory Visit (INDEPENDENT_AMBULATORY_CARE_PROVIDER_SITE_OTHER): Payer: BC Managed Care – PPO | Admitting: Obstetrics and Gynecology

## 2022-12-27 VITALS — BP 126/82 | HR 51 | Ht 67.0 in | Wt 147.0 lb

## 2022-12-27 DIAGNOSIS — Z862 Personal history of diseases of the blood and blood-forming organs and certain disorders involving the immune mechanism: Secondary | ICD-10-CM

## 2022-12-27 DIAGNOSIS — Z113 Encounter for screening for infections with a predominantly sexual mode of transmission: Secondary | ICD-10-CM

## 2022-12-27 DIAGNOSIS — Z114 Encounter for screening for human immunodeficiency virus [HIV]: Secondary | ICD-10-CM

## 2022-12-27 DIAGNOSIS — Z3041 Encounter for surveillance of contraceptive pills: Secondary | ICD-10-CM

## 2022-12-27 DIAGNOSIS — Z01419 Encounter for gynecological examination (general) (routine) without abnormal findings: Secondary | ICD-10-CM

## 2022-12-27 DIAGNOSIS — Z1159 Encounter for screening for other viral diseases: Secondary | ICD-10-CM

## 2022-12-27 MED ORDER — LEVONORGEST-ETH ESTRAD 91-DAY 0.15-0.03 &0.01 MG PO TABS: 1.0000 | ORAL_TABLET | Freq: Every day | ORAL | 3 refills | Status: DC

## 2022-12-27 NOTE — Patient Instructions (Signed)

## 2022-12-30 LAB — CERVICOVAGINAL ANCILLARY ONLY
Chlamydia: NEGATIVE
Comment: NEGATIVE
Comment: NEGATIVE
Comment: NORMAL
Neisseria Gonorrhea: NEGATIVE
Trichomonas: NEGATIVE

## 2023-12-23 ENCOUNTER — Encounter: Payer: Self-pay | Admitting: Obstetrics and Gynecology

## 2023-12-23 DIAGNOSIS — Z3041 Encounter for surveillance of contraceptive pills: Secondary | ICD-10-CM

## 2023-12-23 MED ORDER — LEVONORGEST-ETH ESTRAD 91-DAY 0.15-0.03 &0.01 MG PO TABS: 1.0000 | ORAL_TABLET | Freq: Every day | ORAL | 1 refills | Status: DC

## 2023-12-23 NOTE — Telephone Encounter (Signed)
 Med refill request:Ana Becker 0.15-0.03/0.01 mg tab Last AEX: 12/27/22 -BS Next AEX: 07/07/24 -BS Last MMG (if hormonal med) N/A Refill authorized: Please Advise?

## 2024-07-02 ENCOUNTER — Other Ambulatory Visit: Payer: Self-pay | Admitting: Obstetrics and Gynecology

## 2024-07-02 DIAGNOSIS — Z3041 Encounter for surveillance of contraceptive pills: Secondary | ICD-10-CM

## 2024-07-02 NOTE — Telephone Encounter (Signed)
 Med refill request:    Levonorgestrel-Ethinyl Estradiol (AMETHIA) 0.15-0.03 &0.01 MG tablet  Start:  12/23/23 Disp:    91 tablets Refills:  1  Last AEX:  12/27/22 Next AEX:  07/07/24 Last MMG (if hormonal med):  N/A Refill authorized? Please Advise.

## 2024-07-07 ENCOUNTER — Other Ambulatory Visit (HOSPITAL_COMMUNITY)
Admission: RE | Admit: 2024-07-07 | Discharge: 2024-07-07 | Disposition: A | Source: Ambulatory Visit | Attending: Obstetrics and Gynecology | Admitting: Obstetrics and Gynecology

## 2024-07-07 ENCOUNTER — Ambulatory Visit: Admitting: Obstetrics and Gynecology

## 2024-07-07 ENCOUNTER — Encounter: Payer: Self-pay | Admitting: Obstetrics and Gynecology

## 2024-07-07 VITALS — BP 114/82 | HR 70 | Ht 67.0 in | Wt 165.0 lb

## 2024-07-07 DIAGNOSIS — Z3041 Encounter for surveillance of contraceptive pills: Secondary | ICD-10-CM

## 2024-07-07 DIAGNOSIS — Z3169 Encounter for other general counseling and advice on procreation: Secondary | ICD-10-CM

## 2024-07-07 DIAGNOSIS — Z01419 Encounter for gynecological examination (general) (routine) without abnormal findings: Secondary | ICD-10-CM | POA: Diagnosis not present

## 2024-07-07 DIAGNOSIS — Z1331 Encounter for screening for depression: Secondary | ICD-10-CM | POA: Diagnosis not present

## 2024-07-07 DIAGNOSIS — Z124 Encounter for screening for malignant neoplasm of cervix: Secondary | ICD-10-CM | POA: Insufficient documentation

## 2024-07-07 DIAGNOSIS — Z0000 Encounter for general adult medical examination without abnormal findings: Secondary | ICD-10-CM

## 2024-07-07 MED ORDER — LEVONORGEST-ETH ESTRAD 91-DAY 0.15-0.03 &0.01 MG PO TABS: 1.0000 | ORAL_TABLET | Freq: Every day | ORAL | 3 refills | Status: AC

## 2024-07-07 NOTE — Progress Notes (Signed)
 "  34 y.o. G31P0000 Divorced Caucasian female here for annual exam. Currently on her period.  Wants to continue birth control.    Getting married in April.  Bought a house.   Considering future childbearing.   PCP: Patient, No Pcp Per   Patient's last menstrual period was 07/06/2024 (exact date).     Period Cycle (Days): 90 (OCP) Period Duration (Days): 3 Period Pattern: Regular Menstrual Flow: Light Menstrual Control: Tampon Dysmenorrhea: (!) Mild Dysmenorrhea Symptoms: Cramping     Sexually active: Yes.    The current method of family planning is OCP (estrogen/progesterone).    Menopausal hormone therapy:  n/a Exercising: Yes.    Cross fit Smoker:  no  OB History  Gravida Para Term Preterm AB Living  0 0 0 0 0 0  SAB IAB Ectopic Multiple Live Births  0 0 0 0      HEALTH MAINTENANCE: Last 2 paps:  12/09/19 neg, HR HPV neg, 09/18/17 neg History of abnormal Pap or positive HPV:  no Mammogram:   n/a Colonoscopy:  n/a Bone Density:  n/a  Result  n/a   Immunization History  Administered Date(s) Administered   HPV 9-valent 12/14/2020, 02/19/2021, 06/26/2021   Tdap 06/10/2009, 12/09/2019      reports that she has never smoked. She has never used smokeless tobacco. She reports current alcohol use. She reports that she does not use drugs.  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     SIMPESSE 0.15-0.03 &0.01 MG tablet TAKE 1 TABLET BY MOUTH EVERY DAY 91 tablet 0   No current facility-administered medications for this visit.     ALLERGIES: Triamcinolone acetonide  Family History  Problem Relation Age of Onset   Hypertension Mother    Heart disease Father    Atrial fibrillation Father    Heart attack Father    Stroke Paternal Grandfather    Hypertension Maternal Grandmother    Hypertension Maternal Grandfather    Heart disease Maternal Grandfather     Review of Systems  All other systems reviewed and are negative.   PHYSICAL EXAM:  BP 114/82 (BP Location: Left Arm, Patient Position: Sitting)   Pulse 70   Ht 5' 7 (1.702 m)   Wt 165 lb (74.8 kg)   LMP 07/06/2024 (Exact Date)   SpO2 98%   BMI 25.84 kg/m     General appearance: alert, cooperative and appears stated age Head: normocephalic, without obvious abnormality, atraumatic Neck: no adenopathy, supple, symmetrical, trachea midline and thyroid normal to inspection and palpation Lungs: clear to auscultation bilaterally Breasts: normal appearance, no masses or tenderness, No nipple retraction or dimpling, No nipple discharge or bleeding, No axillary adenopathy Heart: regular rate and rhythm Abdomen: soft, non-tender; no masses, no organomegaly Extremities: extremities normal, atraumatic, no cyanosis or edema Skin: skin color, texture, turgor normal. No rashes or lesions Lymph nodes: cervical, supraclavicular, and axillary nodes normal. Neurologic: grossly normal  Pelvic: External genitalia:  no  lesions              No abnormal inguinal nodes palpated.              Urethra:  normal appearing urethra with no masses, tenderness or lesions              Bartholins and Skenes: normal                 Vagina: normal appearing vagina with normal color and discharge, no lesions              Cervix: no lesions.  Light menstrual flow.               Pap taken: yes Bimanual Exam:  Uterus:  normal size, contour, position, consistency, mobility, non-tender              Adnexa: no mass, fullness, tenderness            Chaperone was present for exam:   Kari HERO, CMA  ASSESSMENT: Well woman visit with gynecologic exam. Birth control pill surveillance. Hx ITP. Status post splenectomy.  Hx stress fracture. PHQ-2-9: 0 Preconception counseling.   Routine labs.  PLAN: Mammogram screening discussed. Self breast awareness reviewed. Pap and HRV collected:  yes Guidelines for Calcium, Vitamin D, regular exercise program including cardiovascular and weight bearing exercise. Medication refills:  COCs for one year.  Labs:  lipids, CMP, CBC, Rubella titer. Start PNV at least 3 months prior to trying for pregnancy.  Avoid ETOH, tobacco, unnecessary medications, cat liter box, herbal/complementary medications while trying for pregnancy.  Recommended reading in preparation for pregnancy, What to Expect Before You're Expecting. Follow up:  yearly and prn.             "

## 2024-07-07 NOTE — Patient Instructions (Signed)

## 2024-07-08 LAB — CBC
HCT: 42.8 % (ref 35.9–46.0)
Hemoglobin: 14.4 g/dL (ref 11.7–15.5)
MCH: 29.9 pg (ref 27.0–33.0)
MCHC: 33.6 g/dL (ref 31.6–35.4)
MCV: 89 fL (ref 81.4–101.7)
MPV: 11.1 fL (ref 7.5–12.5)
Platelets: 353 10*3/uL (ref 140–400)
RBC: 4.81 Million/uL (ref 3.80–5.10)
RDW: 12.5 % (ref 11.0–15.0)
WBC: 7.3 10*3/uL (ref 3.8–10.8)

## 2024-07-08 LAB — COMPREHENSIVE METABOLIC PANEL WITH GFR
AG Ratio: 1.3 (calc) (ref 1.0–2.5)
ALT: 32 U/L — ABNORMAL HIGH (ref 6–29)
AST: 23 U/L (ref 10–30)
Albumin: 4.5 g/dL (ref 3.6–5.1)
Alkaline phosphatase (APISO): 66 U/L (ref 31–125)
BUN/Creatinine Ratio: 11 (calc) (ref 6–22)
BUN: 11 mg/dL (ref 7–25)
CO2: 25 mmol/L (ref 20–32)
Calcium: 9 mg/dL (ref 8.6–10.2)
Chloride: 107 mmol/L (ref 98–110)
Creat: 1.02 mg/dL — ABNORMAL HIGH (ref 0.50–0.97)
Globulin: 3.5 g/dL (ref 1.9–3.7)
Glucose, Bld: 85 mg/dL (ref 65–99)
Potassium: 4.3 mmol/L (ref 3.5–5.3)
Sodium: 141 mmol/L (ref 135–146)
Total Bilirubin: 0.5 mg/dL (ref 0.2–1.2)
Total Protein: 8 g/dL (ref 6.1–8.1)
eGFR: 74 mL/min/{1.73_m2}

## 2024-07-08 LAB — RUBELLA SCREEN: Rubella: 5.11 {index}

## 2024-07-08 LAB — LIPID PANEL
Cholesterol: 178 mg/dL
HDL: 61 mg/dL
LDL Cholesterol (Calc): 102 mg/dL — ABNORMAL HIGH
Non-HDL Cholesterol (Calc): 117 mg/dL
Total CHOL/HDL Ratio: 2.9 (calc)
Triglycerides: 68 mg/dL

## 2024-07-09 ENCOUNTER — Ambulatory Visit: Payer: Self-pay | Admitting: Obstetrics and Gynecology

## 2024-07-09 DIAGNOSIS — R7401 Elevation of levels of liver transaminase levels: Secondary | ICD-10-CM

## 2024-07-09 DIAGNOSIS — R7989 Other specified abnormal findings of blood chemistry: Secondary | ICD-10-CM

## 2024-07-09 LAB — CYTOLOGY - PAP
Comment: NEGATIVE
Diagnosis: NEGATIVE
High risk HPV: NEGATIVE
